# Patient Record
Sex: Female | Born: 1985 | Race: Black or African American | Hispanic: No | Marital: Married | Smoking: Former smoker
Health system: Southern US, Community
[De-identification: ages and names within clinical notes are randomized; demographics above are authoritative.]

## PROBLEM LIST (undated history)

## (undated) DIAGNOSIS — I10 Essential (primary) hypertension: Secondary | ICD-10-CM

## (undated) DIAGNOSIS — R0600 Dyspnea, unspecified: Secondary | ICD-10-CM

## (undated) DIAGNOSIS — F319 Bipolar disorder, unspecified: Secondary | ICD-10-CM

## (undated) DIAGNOSIS — M1990 Unspecified osteoarthritis, unspecified site: Secondary | ICD-10-CM

## (undated) DIAGNOSIS — J45909 Unspecified asthma, uncomplicated: Secondary | ICD-10-CM

## (undated) DIAGNOSIS — F419 Anxiety disorder, unspecified: Secondary | ICD-10-CM

## (undated) DIAGNOSIS — K219 Gastro-esophageal reflux disease without esophagitis: Secondary | ICD-10-CM

## (undated) DIAGNOSIS — T7840XA Allergy, unspecified, initial encounter: Secondary | ICD-10-CM

## (undated) DIAGNOSIS — D649 Anemia, unspecified: Secondary | ICD-10-CM

## (undated) HISTORY — PX: APPENDECTOMY: SHX54

## (undated) HISTORY — DX: Gastro-esophageal reflux disease without esophagitis: K21.9

## (undated) HISTORY — DX: Allergy, unspecified, initial encounter: T78.40XA

## (undated) HISTORY — DX: Unspecified osteoarthritis, unspecified site: M19.90

## (undated) HISTORY — DX: Unspecified asthma, uncomplicated: J45.909

## (undated) HISTORY — DX: Essential (primary) hypertension: I10

## (undated) HISTORY — PX: WISDOM TOOTH EXTRACTION: SHX21

## (undated) HISTORY — DX: Anxiety disorder, unspecified: F41.9

---

## 2014-06-25 ENCOUNTER — Emergency Department (HOSPITAL_COMMUNITY): Payer: Self-pay

## 2014-06-25 ENCOUNTER — Encounter (HOSPITAL_COMMUNITY): Payer: Self-pay | Admitting: Emergency Medicine

## 2014-06-25 ENCOUNTER — Emergency Department (HOSPITAL_COMMUNITY)
Admission: EM | Admit: 2014-06-25 | Discharge: 2014-06-25 | Disposition: A | Discharge status: Home / Self Care | Payer: Self-pay | Attending: Emergency Medicine | Admitting: Emergency Medicine

## 2014-06-25 DIAGNOSIS — D251 Intramural leiomyoma of uterus: Secondary | ICD-10-CM | POA: Insufficient documentation

## 2014-06-25 DIAGNOSIS — B9689 Other specified bacterial agents as the cause of diseases classified elsewhere: Secondary | ICD-10-CM | POA: Insufficient documentation

## 2014-06-25 DIAGNOSIS — A499 Bacterial infection, unspecified: Secondary | ICD-10-CM | POA: Insufficient documentation

## 2014-06-25 DIAGNOSIS — R109 Unspecified abdominal pain: Secondary | ICD-10-CM | POA: Insufficient documentation

## 2014-06-25 DIAGNOSIS — R42 Dizziness and giddiness: Secondary | ICD-10-CM | POA: Insufficient documentation

## 2014-06-25 DIAGNOSIS — N760 Acute vaginitis: Secondary | ICD-10-CM | POA: Insufficient documentation

## 2014-06-25 DIAGNOSIS — Z3202 Encounter for pregnancy test, result negative: Secondary | ICD-10-CM | POA: Insufficient documentation

## 2014-06-25 DIAGNOSIS — R350 Frequency of micturition: Secondary | ICD-10-CM | POA: Insufficient documentation

## 2014-06-25 DIAGNOSIS — IMO0002 Reserved for concepts with insufficient information to code with codable children: Secondary | ICD-10-CM | POA: Insufficient documentation

## 2014-06-25 DIAGNOSIS — F17200 Nicotine dependence, unspecified, uncomplicated: Secondary | ICD-10-CM | POA: Insufficient documentation

## 2014-06-25 LAB — COMPREHENSIVE METABOLIC PANEL
ALBUMIN: 4 g/dL (ref 3.5–5.2)
ALT: 15 U/L (ref 0–35)
AST: 17 U/L (ref 0–37)
Alkaline Phosphatase: 55 U/L (ref 39–117)
Anion gap: 14 (ref 5–15)
BUN: 11 mg/dL (ref 6–23)
CHLORIDE: 103 meq/L (ref 96–112)
CO2: 22 meq/L (ref 19–32)
Calcium: 9.7 mg/dL (ref 8.4–10.5)
Creatinine, Ser: 0.77 mg/dL (ref 0.50–1.10)
GFR calc Af Amer: >90 mL/min (ref >90)
Glucose, Bld: 86 mg/dL (ref 70–99)
Potassium: 3.9 mEq/L (ref 3.7–5.3)
SODIUM: 139 meq/L (ref 137–147)
Total Bilirubin: 0.2 mg/dL — ABNORMAL LOW (ref 0.3–1.2)
Total Protein: 8.7 g/dL — ABNORMAL HIGH (ref 6.0–8.3)

## 2014-06-25 LAB — CBC WITH DIFFERENTIAL/PLATELET
BASOS ABS: 0 10*3/uL (ref 0.0–0.1)
BASOS PCT: 0 % (ref 0–1)
Eosinophils Absolute: 0.2 10*3/uL (ref 0.0–0.7)
Eosinophils Relative: 2 % (ref 0–5)
HEMATOCRIT: 37 % (ref 36.0–46.0)
Hemoglobin: 12.5 g/dL (ref 12.0–15.0)
LYMPHS PCT: 41 % (ref 12–46)
Lymphs Abs: 3.1 10*3/uL (ref 0.7–4.0)
MCH: 29 pg (ref 26.0–34.0)
MCHC: 33.8 g/dL (ref 30.0–36.0)
MCV: 85.8 fL (ref 78.0–100.0)
Monocytes Absolute: 0.6 10*3/uL (ref 0.1–1.0)
Monocytes Relative: 8 % (ref 3–12)
NEUTROS ABS: 3.6 10*3/uL (ref 1.7–7.7)
Neutrophils Relative %: 49 % (ref 43–77)
Platelets: 265 10*3/uL (ref 150–400)
RBC: 4.31 MIL/uL (ref 3.87–5.11)
RDW: 12.7 % (ref 11.5–15.5)
WBC: 7.4 10*3/uL (ref 4.0–10.5)

## 2014-06-25 LAB — URINALYSIS, ROUTINE W REFLEX MICROSCOPIC
Bilirubin Urine: NEGATIVE
GLUCOSE, UA: NEGATIVE mg/dL
Hgb urine dipstick: NEGATIVE
Ketones, ur: NEGATIVE mg/dL
NITRITE: NEGATIVE
Protein, ur: NEGATIVE mg/dL
Specific Gravity, Urine: 1.023 (ref 1.005–1.030)
UROBILINOGEN UA: 1 mg/dL (ref 0.0–1.0)
pH: 6.5 (ref 5.0–8.0)

## 2014-06-25 LAB — PREGNANCY, URINE: Preg Test, Ur: NEGATIVE

## 2014-06-25 LAB — URINE MICROSCOPIC-ADD ON

## 2014-06-25 LAB — WET PREP, GENITAL
Trich, Wet Prep: NONE SEEN
Yeast Wet Prep HPF POC: NONE SEEN

## 2014-06-25 LAB — LIPASE, BLOOD: LIPASE: 36 U/L (ref 11–59)

## 2014-06-25 MED ORDER — MORPHINE SULFATE 4 MG/ML IJ SOLN
4.0000 mg | Freq: Once | INTRAMUSCULAR | Status: AC
Start: 2014-06-25 — End: 2014-06-25
  Administered 2014-06-25: 4 mg via INTRAVENOUS
  Filled 2014-06-25: qty 1

## 2014-06-25 MED ORDER — MECLIZINE HCL 50 MG PO TABS: 50.0000 mg | ORAL_TABLET | Freq: Three times a day (TID) | ORAL | Status: DC | PRN

## 2014-06-25 MED ORDER — ONDANSETRON HCL 4 MG/2ML IJ SOLN
4.0000 mg | Freq: Once | INTRAMUSCULAR | Status: AC
  Administered 2014-06-25: 4 mg via INTRAVENOUS
  Filled 2014-06-25: qty 2

## 2014-06-25 MED ORDER — ONDANSETRON HCL 4 MG PO TABS: 4.0000 mg | ORAL_TABLET | Freq: Four times a day (QID) | ORAL | Status: DC

## 2014-06-25 MED ORDER — METRONIDAZOLE 500 MG PO TABS: 500.0000 mg | ORAL_TABLET | Freq: Two times a day (BID) | ORAL | Status: DC

## 2014-06-25 MED ORDER — SODIUM CHLORIDE 0.9 % IV BOLUS (SEPSIS)
1000.0000 mL | Freq: Once | INTRAVENOUS | Status: AC
  Administered 2014-06-25: 1000 mL via INTRAVENOUS

## 2014-06-25 NOTE — ED Notes (Signed)
Pt states lower abdominal pain.  Slightly to left.  Pt states burning with urination.  No n/v.  Some dizziness.

## 2014-06-25 NOTE — ED Provider Notes (Signed)
CSN: [id number]     Arrival date & time 06/25/14  1603 History   First MD Initiated Contact with Patient 06/25/14 1838     Chief Complaint  Patient presents with  . Abdominal Pain  . Dysuria     (Consider location/radiation/quality/duration/timing/severity/associated sxs/prior Treatment) HPI Comments: Patient presents to the emergency department with chief complaint of lower abdominal pain, as well as dysuria. She states the symptoms have been ongoing for the past 2-3 days. States the pain is mostly located over the bladder and left side of her abdomen. She denies any nausea or vomiting. She reports one episode of nonbloody diarrhea. She has not taken anything to alleviate her symptoms. There are no aggravating factors. The pain does not radiate. Past abdominal surgical history consists of appendectomy and C-section.  Additionally, she states that she has had intermittent dizziness x2-3 weeks. She states that this happens occasionally, and feels like the room is spinning around in circles. It is worsened with movement.  The history is provided by the patient. No language interpreter was used.    History reviewed. No pertinent past medical history. Past Surgical History  Procedure Laterality Date  . Appendectomy    . Cesarean section     History reviewed. No pertinent family history. History  Substance Use Topics  . Smoking status: Current Every Day Smoker -- 0.50 packs/day    Types: Cigarettes  . Smokeless tobacco: Not on file  . Alcohol Use: Yes     Comment: social   OB History   Grav Para Term Preterm Abortions TAB SAB Ect Mult Living                 Review of Systems  Constitutional: Negative for fever and chills.  Respiratory: Negative for shortness of breath.   Cardiovascular: Negative for chest pain.  Gastrointestinal: Positive for abdominal pain. Negative for nausea, vomiting, diarrhea and constipation.  Genitourinary: Positive for frequency and dyspareunia. Negative  for dysuria.  Neurological: Positive for dizziness.  All other systems reviewed and are negative.     Allergies  Review of patient's allergies indicates no known allergies.  Home Medications   Prior to Admission medications   Not on File   BP 118/69  Pulse 85  Temp(Src) 98.7 F (37.1 C) (Oral)  Resp 18  SpO2 100%  LMP 10/29/2013 Physical Exam  Nursing note and vitals reviewed. Constitutional: She is oriented to person, place, and time. She appears well-developed and well-nourished.  HENT:  Head: Normocephalic and atraumatic.  Eyes: Conjunctivae and EOM are normal. Pupils are equal, round, and reactive to light.  Horizontal nystagmus  Neck: Normal range of motion. Neck supple.  Cardiovascular: Normal rate and regular rhythm.  Exam reveals no gallop and no friction rub.   No murmur heard. Pulmonary/Chest: Effort normal and breath sounds normal. No respiratory distress. She has no wheezes. She has no rales. She exhibits no tenderness.  Abdominal: Soft. Bowel sounds are normal. She exhibits no distension and no mass. There is tenderness. There is no rebound and no guarding.  Mild tenderness to palpation over the urinary bladder, and no other focal abdominal tenderness  Genitourinary:  Pelvic exam chaperoned by female ER tech, no right adnexal tenderness, moderate left adnexal tenderness, no uterine tenderness, mild vaginal discharge, no bleeding, no CMT or friability, no foreign body, no injury to the external genitalia, no other significant findings   Musculoskeletal: Normal range of motion. She exhibits no edema and no tenderness.  Patient ambulates appropriately  Neurological: She is alert and oriented to person, place, and time.  CN 3-12 intact, sensation and strength intact throughout  Skin: Skin is warm and dry.  Psychiatric: She has a normal mood and affect. Her behavior is normal. Judgment and thought content normal.    ED Course  Procedures (including critical care  time) Results for orders placed during the hospital encounter of 06/25/14  WET PREP, GENITAL      Result Value Ref Range   Yeast Wet Prep HPF POC NONE SEEN  NONE SEEN   Trich, Wet Prep NONE SEEN  NONE SEEN   Clue Cells Wet Prep HPF POC FEW (*) NONE SEEN   WBC, Wet Prep HPF POC FEW (*) NONE SEEN  URINALYSIS, ROUTINE W REFLEX MICROSCOPIC      Result Value Ref Range   Color, Urine YELLOW  YELLOW   APPearance CLOUDY (*) CLEAR   Specific Gravity, Urine 1.023  1.005 - 1.030   pH 6.5  5.0 - 8.0   Glucose, UA NEGATIVE  NEGATIVE mg/dL   Hgb urine dipstick NEGATIVE  NEGATIVE   Bilirubin Urine NEGATIVE  NEGATIVE   Ketones, ur NEGATIVE  NEGATIVE mg/dL   Protein, ur NEGATIVE  NEGATIVE mg/dL   Urobilinogen, UA 1.0  0.0 - 1.0 mg/dL   Nitrite NEGATIVE  NEGATIVE   Leukocytes, UA TRACE (*) NEGATIVE  CBC WITH DIFFERENTIAL      Result Value Ref Range   WBC 7.4  4.0 - 10.5 K/uL   RBC 4.31  3.87 - 5.11 MIL/uL   Hemoglobin 12.5  12.0 - 15.0 g/dL   HCT 37.0  36.0 - 46.0 %   MCV 85.8  78.0 - 100.0 fL   MCH 29.0  26.0 - 34.0 pg   MCHC 33.8  30.0 - 36.0 g/dL   RDW 12.7  11.5 - 15.5 %   Platelets 265  150 - 400 K/uL   Neutrophils Relative % 49  43 - 77 %   Neutro Abs 3.6  1.7 - 7.7 K/uL   Lymphocytes Relative 41  12 - 46 %   Lymphs Abs 3.1  0.7 - 4.0 K/uL   Monocytes Relative 8  3 - 12 %   Monocytes Absolute 0.6  0.1 - 1.0 K/uL   Eosinophils Relative 2  0 - 5 %   Eosinophils Absolute 0.2  0.0 - 0.7 K/uL   Basophils Relative 0  0 - 1 %   Basophils Absolute 0.0  0.0 - 0.1 K/uL  COMPREHENSIVE METABOLIC PANEL      Result Value Ref Range   Sodium 139  137 - 147 mEq/L   Potassium 3.9  3.7 - 5.3 mEq/L   Chloride 103  96 - 112 mEq/L   CO2 22  19 - 32 mEq/L   Glucose, Bld 86  70 - 99 mg/dL   BUN 11  6 - 23 mg/dL   Creatinine, Ser 0.77  0.50 - 1.10 mg/dL   Calcium 9.7  8.4 - 10.5 mg/dL   Total Protein 8.7 (*) 6.0 - 8.3 g/dL   Albumin 4.0  3.5 - 5.2 g/dL   AST 17  0 - 37 U/L   ALT 15  0 - 35 U/L    Alkaline Phosphatase 55  39 - 117 U/L   Total Bilirubin 0.2 (*) 0.3 - 1.2 mg/dL   GFR calc non Af Amer >90  >90 mL/min   GFR calc Af Amer >90  >90 mL/min   Anion gap 14  5 - 15  LIPASE, BLOOD      Result Value Ref Range   Lipase 36  11 - 59 U/L  PREGNANCY, URINE      Result Value Ref Range   Preg Test, Ur NEGATIVE  NEGATIVE  URINE MICROSCOPIC-ADD ON      Result Value Ref Range   Squamous Epithelial / LPF FEW (*) RARE   WBC, UA 3-6  <3 WBC/hpf   RBC / HPF 0-2  <3 RBC/hpf   US Transvaginal Non-ob  06/25/2014   CLINICAL DATA:  Left side pelvic pain.  EXAM: TRANSABDOMINAL ULTRASOUND OF PELVIS  TECHNIQUE: Transabdominal ultrasound examination of the pelvis was performed including evaluation of the uterus, ovaries, adnexal regions, and pelvic cul-de-sac.  COMPARISON:  None.  FINDINGS: Uterus  Measurements: 9.5 x 5.4 x 6.0 cm. 3 cm anterior left intramural fibroid.  Endometrium  Thickness: 8 mm in thickness.  No focal abnormality visualized.  Right ovary  Measurements: Not visualized due to overlying bowel gas and body habitus. No adnexal masses seen.  Left ovary  Measurements: Not visualized due to overlying bowel gas and body habitus. No adnexal masses seen  Other findings:  Trace free fluid in the pelvis.  IMPRESSION: 3 cm left lateral intramural fibroid.  Neither ovary could be visualized.  No adnexal masses seen.  Trace free fluid.   Electronically Signed   By: Rolm Baptise M.D.   On: 06/25/2014 20:41   US Pelvis Complete  06/25/2014   CLINICAL DATA:  Left side pelvic pain.  EXAM: TRANSABDOMINAL ULTRASOUND OF PELVIS  TECHNIQUE: Transabdominal ultrasound examination of the pelvis was performed including evaluation of the uterus, ovaries, adnexal regions, and pelvic cul-de-sac.  COMPARISON:  None.  FINDINGS: Uterus  Measurements: 9.5 x 5.4 x 6.0 cm. 3 cm anterior left intramural fibroid.  Endometrium  Thickness: 8 mm in thickness.  No focal abnormality visualized.  Right ovary  Measurements: Not  visualized due to overlying bowel gas and body habitus. No adnexal masses seen.  Left ovary  Measurements: Not visualized due to overlying bowel gas and body habitus. No adnexal masses seen  Other findings:  Trace free fluid in the pelvis.  IMPRESSION: 3 cm left lateral intramural fibroid.  Neither ovary could be visualized.  No adnexal masses seen.  Trace free fluid.   Electronically Signed   By: Rolm Baptise M.D.   On: 06/25/2014 20:41     Imaging Review No results found.   EKG Interpretation None      MDM   Final diagnoses:  Intramural leiomyoma of uterus  BV (bacterial vaginosis)   Patient with abdominal pain and dysuria. Will check labs, check urine, and reevaluate. Will treat symptoms.  Suspect that the patient's dizziness she does complain of intermittently for the past couple of weeks could be vertigo. She has horizontal nystagmus. Describes the dizziness as though the room is spinning. Will treat with meclizine. Patient has some tenderness over the left ovary, will order ultrasound.  9:00 PM Patient feels much better now. Ultrasound remarkable for uterine fibroid, wet prep remarkable for small amount of clue cells, will treat for bacterial vaginosis. Recommend NSAIDs for fibroid pain. Recommend meclizine for vertigo dizziness, and primary care followup. Patient understands and agrees with plan. She is not dizzy now. She feels well now. Her abdomen is soft and nontender. Discharged to home. Patient is stable and ready for discharge.    Montine Circle, PA-C 06/25/14 2101

## 2014-06-25 NOTE — ED Notes (Signed)
Also reports dizziness like room is spinning, been happening "a lot" x2 weeks. Also c/o vaginal discharge and irritation.

## 2014-06-25 NOTE — ED Provider Notes (Signed)
Medical screening examination/treatment/procedure(s) were performed by non-physician practitioner and as supervising physician I was immediately available for consultation/collaboration.   EKG Interpretation None        [place], DO 06/25/14 2124

## 2014-06-25 NOTE — Discharge Instructions (Signed)
Bacterial Vaginosis Bacterial vaginosis is a vaginal infection that occurs when the normal balance of bacteria in the vagina is disrupted. It results from an overgrowth of certain bacteria. This is the most common vaginal infection in women of childbearing age. Treatment is important to prevent complications, especially in pregnant women, as it can cause a premature delivery. CAUSES  Bacterial vaginosis is caused by an increase in harmful bacteria that are normally present in smaller amounts in the vagina. Several different kinds of bacteria can cause bacterial vaginosis. However, the reason that the condition develops is not fully understood. RISK FACTORS Certain activities or behaviors can put you at an increased risk of developing bacterial vaginosis, including:  Having a new sex partner or multiple sex partners.  Douching.  Using an intrauterine device (IUD) for contraception. Women do not get bacterial vaginosis from toilet seats, bedding, swimming pools, or contact with objects around them. SIGNS AND SYMPTOMS  Some women with bacterial vaginosis have no signs or symptoms. Common symptoms include:  Grey vaginal discharge.  A fishlike odor with discharge, especially after sexual intercourse.  Itching or burning of the vagina and vulva.  Burning or pain with urination. DIAGNOSIS  Your health care provider will take a medical history and examine the vagina for signs of bacterial vaginosis. A sample of vaginal fluid may be taken. Your health care provider will look at this sample under a microscope to check for bacteria and abnormal cells. A vaginal pH test may also be done.  TREATMENT  Bacterial vaginosis may be treated with antibiotic medicines. These may be given in the form of a pill or a vaginal cream. A second round of antibiotics may be prescribed if the condition comes back after treatment.  HOME CARE INSTRUCTIONS   Only take over-the-counter or prescription medicines as  directed by your health care provider.  If antibiotic medicine was prescribed, take it as directed. Make sure you finish it even if you start to feel better.  Do not have sex until treatment is completed.  Tell all sexual partners that you have a vaginal infection. They should see their health care provider and be treated if they have problems, such as a mild rash or itching.  Practice safe sex by using condoms and only having one sex partner. SEEK MEDICAL CARE IF:   Your symptoms are not improving after 3 days of treatment.  You have increased discharge or pain.  You have a fever. MAKE SURE YOU:   Understand these instructions.  Will watch your condition.  Will get help right away if you are not doing well or get worse. FOR MORE INFORMATION  Centers for Disease Control and Prevention, Division of STD Prevention: AppraiserFraud.fi American Sexual Health Association (ASHA): www.ashastd.org  Document Released: 10/15/2005 Document Revised: 08/05/2013 Document Reviewed: 05/27/2013 [facility] Patient Information 2015 [place], [place]. This information is not intended to replace advice given to you by your health care provider. Make sure you discuss any questions you have with your health care provider. Fibroids Fibroids are lumps (tumors) that can occur any place in a woman's body. These lumps are not cancerous. Fibroids vary in size, weight, and where they grow. HOME CARE  Do not take aspirin.  Write down the number of pads or tampons you use during your period. Tell your doctor. This can help determine the best treatment for you. GET HELP RIGHT AWAY IF:  You have pain in your lower belly (abdomen) that is not helped with medicine.  You have cramps  that are not helped with medicine.  You have more bleeding between or during your period.  You feel lightheaded or pass out (faint).  Your lower belly pain gets worse. MAKE SURE YOU:  Understand these instructions.  Will watch your  condition.  Will get help right away if you are not doing well or get worse. Document Released: 11/17/2010 Document Revised: 01/07/2012 Document Reviewed: 11/17/2010 [facility] Patient Information 2015 [place], [place]. This information is not intended to replace advice given to you by your health care provider. Make sure you discuss any questions you have with your health care provider.

## 2014-06-26 LAB — GC/CHLAMYDIA PROBE AMP
CT PROBE, AMP APTIMA: NEGATIVE
GC Probe RNA: NEGATIVE

## 2016-05-02 ENCOUNTER — Encounter: Payer: Self-pay | Admitting: Physical Therapy

## 2016-05-02 ENCOUNTER — Ambulatory Visit: Payer: Medicaid Other | Attending: Primary Care | Admitting: Physical Therapy

## 2016-05-02 DIAGNOSIS — M6281 Muscle weakness (generalized): Secondary | ICD-10-CM | POA: Diagnosis present

## 2016-05-02 DIAGNOSIS — M5416 Radiculopathy, lumbar region: Secondary | ICD-10-CM | POA: Diagnosis not present

## 2016-05-02 DIAGNOSIS — R293 Abnormal posture: Secondary | ICD-10-CM | POA: Diagnosis present
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
## 2016-05-02 NOTE — Patient Instructions (Addendum)

## 2018-05-02 DIAGNOSIS — F319 Bipolar disorder, unspecified: Secondary | ICD-10-CM | POA: Diagnosis not present

## 2018-05-08 DIAGNOSIS — H40033 Anatomical narrow angle, bilateral: Secondary | ICD-10-CM | POA: Diagnosis not present

## 2018-05-08 DIAGNOSIS — H5213 Myopia, bilateral: Secondary | ICD-10-CM | POA: Diagnosis not present

## 2018-05-08 DIAGNOSIS — H16223 Keratoconjunctivitis sicca, not specified as Sjogren's, bilateral: Secondary | ICD-10-CM | POA: Diagnosis not present

## 2018-05-09 DIAGNOSIS — F319 Bipolar disorder, unspecified: Secondary | ICD-10-CM | POA: Diagnosis not present

## 2018-05-16 DIAGNOSIS — F319 Bipolar disorder, unspecified: Secondary | ICD-10-CM | POA: Diagnosis not present

## 2018-05-26 DIAGNOSIS — F319 Bipolar disorder, unspecified: Secondary | ICD-10-CM | POA: Diagnosis not present

## 2018-05-27 DIAGNOSIS — Z79899 Other long term (current) drug therapy: Secondary | ICD-10-CM | POA: Diagnosis not present

## 2018-05-27 DIAGNOSIS — F411 Generalized anxiety disorder: Secondary | ICD-10-CM | POA: Diagnosis not present

## 2018-05-29 DIAGNOSIS — H1013 Acute atopic conjunctivitis, bilateral: Secondary | ICD-10-CM | POA: Diagnosis not present

## 2018-05-29 DIAGNOSIS — H5203 Hypermetropia, bilateral: Secondary | ICD-10-CM | POA: Diagnosis not present

## 2018-05-30 DIAGNOSIS — F319 Bipolar disorder, unspecified: Secondary | ICD-10-CM | POA: Diagnosis not present

## 2018-06-06 DIAGNOSIS — F319 Bipolar disorder, unspecified: Secondary | ICD-10-CM | POA: Diagnosis not present

## 2018-06-13 DIAGNOSIS — F319 Bipolar disorder, unspecified: Secondary | ICD-10-CM | POA: Diagnosis not present

## 2018-06-20 DIAGNOSIS — F319 Bipolar disorder, unspecified: Secondary | ICD-10-CM | POA: Diagnosis not present

## 2018-06-23 DIAGNOSIS — F319 Bipolar disorder, unspecified: Secondary | ICD-10-CM | POA: Diagnosis not present

## 2018-07-04 DIAGNOSIS — F319 Bipolar disorder, unspecified: Secondary | ICD-10-CM | POA: Diagnosis not present

## 2018-07-10 ENCOUNTER — Ambulatory Visit: Payer: Medicaid Other | Attending: Family Medicine | Admitting: Family Medicine

## 2018-07-10 ENCOUNTER — Encounter: Payer: Self-pay | Admitting: Family Medicine

## 2018-07-10 ENCOUNTER — Other Ambulatory Visit: Payer: Self-pay

## 2018-07-10 VITALS — BP 111/71 | HR 82 | Temp 98.5°F | Resp 18 | Ht 66.0 in | Wt 239.6 lb

## 2018-07-10 DIAGNOSIS — F419 Anxiety disorder, unspecified: Secondary | ICD-10-CM | POA: Diagnosis not present

## 2018-07-10 DIAGNOSIS — M5440 Lumbago with sciatica, unspecified side: Secondary | ICD-10-CM

## 2018-07-10 DIAGNOSIS — M545 Low back pain, unspecified: Secondary | ICD-10-CM

## 2018-07-10 DIAGNOSIS — G8929 Other chronic pain: Secondary | ICD-10-CM | POA: Diagnosis not present

## 2018-07-10 DIAGNOSIS — D259 Leiomyoma of uterus, unspecified: Secondary | ICD-10-CM

## 2018-07-10 DIAGNOSIS — R5383 Other fatigue: Secondary | ICD-10-CM | POA: Diagnosis not present

## 2018-07-10 DIAGNOSIS — M79671 Pain in right foot: Secondary | ICD-10-CM | POA: Diagnosis not present

## 2018-07-10 DIAGNOSIS — E010 Iodine-deficiency related diffuse (endemic) goiter: Secondary | ICD-10-CM | POA: Diagnosis not present

## 2018-07-10 DIAGNOSIS — D649 Anemia, unspecified: Secondary | ICD-10-CM | POA: Diagnosis not present

## 2018-07-10 DIAGNOSIS — Z79899 Other long term (current) drug therapy: Secondary | ICD-10-CM | POA: Diagnosis not present

## 2018-07-10 MED ORDER — IBUPROFEN 600 MG PO TABS: ORAL_TABLET | ORAL | 2 refills | Status: DC
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
## 2018-07-10 NOTE — Progress Notes (Signed)
Flu shot: no  Pain: back 9 for 8 years  , right foot 7: 2 months

## 2018-07-11 ENCOUNTER — Telehealth: Payer: Self-pay

## 2018-07-11 DIAGNOSIS — F319 Bipolar disorder, unspecified: Secondary | ICD-10-CM | POA: Diagnosis not present

## 2018-07-11 LAB — CBC WITH DIFFERENTIAL/PLATELET
Basophils Absolute: 0 x10E3/uL (ref 0.0–0.2)
Basos: 0 %
EOS (ABSOLUTE): 0.1 x10E3/uL (ref 0.0–0.4)
Eos: 2 %
Hematocrit: 35 % (ref 34.0–46.6)
Hemoglobin: 11.2 g/dL (ref 11.1–15.9)
Immature Grans (Abs): 0 x10E3/uL (ref 0.0–0.1)
Immature Granulocytes: 0 %
Lymphocytes Absolute: 2.3 x10E3/uL (ref 0.7–3.1)
Lymphs: 38 %
MCH: 27.7 pg (ref 26.6–33.0)
MCHC: 32 g/dL (ref 31.5–35.7)
MCV: 87 fL (ref 79–97)
Monocytes Absolute: 0.6 x10E3/uL (ref 0.1–0.9)
Monocytes: 10 %
Neutrophils Absolute: 3 x10E3/uL (ref 1.4–7.0)
Neutrophils: 50 %
Platelets: 288 x10E3/uL (ref 150–450)
RBC: 4.04 x10E6/uL (ref 3.77–5.28)
RDW: 12.2 % — ABNORMAL LOW (ref 12.3–15.4)
WBC: 6 x10E3/uL (ref 3.4–10.8)

## 2018-07-11 LAB — COMPREHENSIVE METABOLIC PANEL WITH GFR
ALT: 13 IU/L (ref 0–32)
AST: 17 IU/L (ref 0–40)
Albumin/Globulin Ratio: 1.3 (ref 1.2–2.2)
Albumin: 4.4 g/dL (ref 3.5–5.5)
Alkaline Phosphatase: 56 IU/L (ref 39–117)
BUN/Creatinine Ratio: 13 (ref 9–23)
BUN: 11 mg/dL (ref 6–20)
Bilirubin Total: 0.3 mg/dL (ref 0.0–1.2)
CO2: 22 mmol/L (ref 20–29)
Calcium: 9.9 mg/dL (ref 8.7–10.2)
Chloride: 103 mmol/L (ref 96–106)
Creatinine, Ser: 0.83 mg/dL (ref 0.57–1.00)
GFR calc Af Amer: 107 mL/min/1.73
GFR calc non Af Amer: 93 mL/min/1.73
Globulin, Total: 3.4 g/dL (ref 1.5–4.5)
Glucose: 89 mg/dL (ref 65–99)
Potassium: 4.2 mmol/L (ref 3.5–5.2)
Sodium: 140 mmol/L (ref 134–144)
Total Protein: 7.8 g/dL (ref 6.0–8.5)

## 2018-07-11 LAB — TSH: TSH: 4.35 u[IU]/mL (ref 0.450–4.500)

## 2018-07-11 NOTE — Telephone Encounter (Signed)
Patient was called, answered, verified DOB. Patient was informed and verbalized understanding of most recent lab results, no further questions.

## 2018-07-11 NOTE — Telephone Encounter (Signed)
-----   Message from Antony Blackbird, MD sent at 07/11/2018 10:46 AM EDT ----- Please notify patient of normal CBC, normal CMP normal TSH

## 2018-07-18 DIAGNOSIS — F319 Bipolar disorder, unspecified: Secondary | ICD-10-CM | POA: Diagnosis not present

## 2018-07-21 DIAGNOSIS — M129 Arthropathy, unspecified: Secondary | ICD-10-CM | POA: Diagnosis not present

## 2018-07-21 DIAGNOSIS — Z79899 Other long term (current) drug therapy: Secondary | ICD-10-CM | POA: Diagnosis not present

## 2018-07-21 DIAGNOSIS — M5440 Lumbago with sciatica, unspecified side: Secondary | ICD-10-CM | POA: Diagnosis not present

## 2018-07-21 DIAGNOSIS — G894 Chronic pain syndrome: Secondary | ICD-10-CM | POA: Diagnosis not present

## 2018-07-29 ENCOUNTER — Ambulatory Visit: Payer: Medicaid Other | Admitting: Podiatry

## 2018-07-29 ENCOUNTER — Encounter: Payer: Self-pay | Admitting: Podiatry

## 2018-07-29 ENCOUNTER — Ambulatory Visit (INDEPENDENT_AMBULATORY_CARE_PROVIDER_SITE_OTHER): Payer: Medicaid Other

## 2018-07-29 VITALS — BP 114/74 | HR 75

## 2018-07-29 DIAGNOSIS — S91331S Puncture wound without foreign body, right foot, sequela: Secondary | ICD-10-CM

## 2018-07-29 DIAGNOSIS — D492 Neoplasm of unspecified behavior of bone, soft tissue, and skin: Secondary | ICD-10-CM

## 2018-07-29 DIAGNOSIS — M79671 Pain in right foot: Secondary | ICD-10-CM

## 2018-07-29 DIAGNOSIS — Q828 Other specified congenital malformations of skin: Secondary | ICD-10-CM

## 2018-07-29 NOTE — Patient Instructions (Signed)
Keep the bandage on for 24 hours. At that time, remove and clean with soap and water. If it hurts or burns before 24 hours go ahead and remove the bandage and wash with soap and water. Keep the area clean. If there is any blistering cover with antibiotic ointment and a bandage. Monitor for any redness, drainage, or other signs of infection. Call the office if any are to occur. If you have any questions, please call the office at [phone].  If was nice to meet you today. If you have any questions or any further concerns, please feel fee to give me a call. You can call our office at [phone] or please feel fee to send me a message through [place].

## 2018-08-01 DIAGNOSIS — F319 Bipolar disorder, unspecified: Secondary | ICD-10-CM | POA: Diagnosis not present

## 2018-08-03 NOTE — Progress Notes (Signed)
Subjective:   Patient ID: Marsa Aris, female   DOB: 32 y.o.   MRN: [id number]   HPI 32 year old female presents the office today for concerns of pain to his right foot.  States that a year ago he injured his foot as he stepped on a nail over a year ago.  He did not seek treatment what time she was tetanus is up-to-date.  He states that the nail did not break and only punctured the skin lightly.  Since that he is developed a callus submetatarsal 5 and he gets pain with pressure to the area.  He states that it causes weight to shift resulting in pain to other areas of his foot.   Review of Systems  All other systems reviewed and are negative.  Past Medical History:  Diagnosis Date  . Anxiety    reports dx from previous MD  . Hypertension     Past Surgical History:  Procedure Laterality Date  . APPENDECTOMY    . CESAREAN SECTION       Current Outpatient Medications:  .  atomoxetine (STRATTERA) 40 MG capsule, Take 40 mg by mouth daily., Disp: , Rfl:  .  cholecalciferol (VITAMIN D) 1000 units tablet, Take 1,000 Units by mouth daily., Disp: , Rfl:  .  clonazePAM (KLONOPIN) 0.5 MG tablet, Take 0.5 mg by mouth 2 (two) times daily as needed for anxiety., Disp: , Rfl:  .  cycloSPORINE (RESTASIS) 0.05 % ophthalmic emulsion, 1 drop 2 (two) times daily., Disp: , Rfl:  .  etonogestrel (IMPLANON) 68 MG IMPL implant, Inject 1 each into the skin once., Disp: , Rfl:  .  hydrochlorothiazide (MICROZIDE) 12.5 MG capsule, Take 12.5 mg by mouth daily., Disp: , Rfl:  .  ibuprofen (ADVIL,MOTRIN) 600 MG tablet, 1 every 8 hours as needed for pain, take after eating, Disp: 60 tablet, Rfl: 2 .  meclizine (ANTIVERT) 50 MG tablet, Take 1 tablet (50 mg total) by mouth 3 (three) times daily as needed. (Patient not taking: Reported on 05/02/2016), Disp: 30 tablet, Rfl: 0 .  metroNIDAZOLE (FLAGYL) 500 MG tablet, Take 1 tablet (500 mg total) by mouth 2 (two) times daily. (Patient not taking: Reported on 05/02/2016),  Disp: 14 tablet, Rfl: 0 .  Olopatadine HCl (PAZEO) 0.7 % SOLN, Apply to eye., Disp: , Rfl:  .  ondansetron (ZOFRAN) 4 MG tablet, Take 1 tablet (4 mg total) by mouth every 6 (six) hours. (Patient not taking: Reported on 05/02/2016), Disp: 12 tablet, Rfl: 0 .  oxyCODONE-acetaminophen (PERCOCET/ROXICET) 5-325 MG tablet, Take 2 tablets by mouth every 4 (four) hours as needed for severe pain., Disp: , Rfl:   No Known Allergies        Objective:  Physical Exam  General: AAO x3, NAD  Dermatological: Hyperkeratotic tissue right foot submetatarsal 5.  Upon treatment there is no underlying ulceration, drainage or signs of infection.  There is no evidence of foreign body identified.  No other lesions identified.  Vascular: Dorsalis Pedis artery and Posterior Tibial artery pedal pulses are 2/4 bilateral with immedate capillary fill time. There is no pain with calf compression, swelling, warmth, erythema.   Neruologic: Grossly intact via light touch bilateral.  Protective threshold with Semmes Wienstein monofilament intact to all pedal sites bilateral.   Musculoskeletal: Tenderness the hyperkeratotic lesion but no other areas of tenderness identified.. Muscular strength 5/5 in all groups tested bilateral.  Gait: Unassisted, Nonantalgic.       Assessment:   32 year old female with symptomatic hyperkeratotic lesion right  foot     Plan:  -Treatment options discussed including all alternatives, risks, and complications -Etiology of symptoms were discussed -X-rays were obtained and reviewed with the patient.  There is no evidence of foreign body or fracture. -Lesion was sharply debrided x1 without any complications or bleeding.  Areas cleaned with alcohol and a pad was placed followed by salicylic acid and a bandage.  Post procedure instructions were discussed.  Monitoring signs or symptoms of infection.  Offloading.  Trula Slade DPM

## 2018-08-04 DIAGNOSIS — M5416 Radiculopathy, lumbar region: Secondary | ICD-10-CM | POA: Diagnosis not present

## 2018-08-04 DIAGNOSIS — G8929 Other chronic pain: Secondary | ICD-10-CM | POA: Diagnosis not present

## 2018-08-04 DIAGNOSIS — Z79899 Other long term (current) drug therapy: Secondary | ICD-10-CM | POA: Diagnosis not present

## 2018-08-04 DIAGNOSIS — G894 Chronic pain syndrome: Secondary | ICD-10-CM | POA: Diagnosis not present

## 2018-08-05 DIAGNOSIS — F319 Bipolar disorder, unspecified: Secondary | ICD-10-CM | POA: Diagnosis not present

## 2018-08-11 DIAGNOSIS — F319 Bipolar disorder, unspecified: Secondary | ICD-10-CM | POA: Diagnosis not present

## 2018-08-18 DIAGNOSIS — Z79899 Other long term (current) drug therapy: Secondary | ICD-10-CM | POA: Diagnosis not present

## 2018-08-18 DIAGNOSIS — F319 Bipolar disorder, unspecified: Secondary | ICD-10-CM | POA: Diagnosis not present

## 2018-08-18 DIAGNOSIS — F411 Generalized anxiety disorder: Secondary | ICD-10-CM | POA: Diagnosis not present

## 2018-08-19 ENCOUNTER — Encounter: Payer: Self-pay | Admitting: Podiatry

## 2018-08-19 ENCOUNTER — Ambulatory Visit: Payer: Medicaid Other | Admitting: Podiatry

## 2018-08-19 DIAGNOSIS — Q828 Other specified congenital malformations of skin: Secondary | ICD-10-CM

## 2018-08-19 DIAGNOSIS — D492 Neoplasm of unspecified behavior of bone, soft tissue, and skin: Secondary | ICD-10-CM | POA: Diagnosis not present

## 2018-08-24 NOTE — Progress Notes (Signed)
Subjective: 32 year old female presents the office today for follow-up evaluation of a hyperkeratotic lesion on the ball of her right foot.  She states that looks and feels much better.  No complications after the treatment appointment.  No other concerns. Denies any systemic complaints such as fevers, chills, nausea, vomiting. No acute changes since last appointment, and no other complaints at this time.   Objective: AAO x3, NAD DP/PT pulses palpable bilaterally, CRT less than 3 seconds Hyperkeratotic lesion right foot submetatarsal 5.  Upon debridement there is no underlying ulceration, drainage or any signs of infection and there is no foreign body noted. No open lesions or pre-ulcerative lesions.  No pain with calf compression, swelling, warmth, erythema  Assessment: Improving hyperkeratotic lesion right foot, porokeratosis  Plan: -All treatment options discussed with the patient including all alternatives, risks, complications.  -Lesion sharply debrided today with a 312 scalpel without any complications or bleeding.  Areas cleaned with alcohol and a pad was placed followed by salicylic acid and a bandage.  Post procedure instructions discussed.  Monitoring signs or symptoms of infection discussed. -Patient encouraged to call the office with any questions, concerns, change in symptoms.   Trula Slade DPM

## 2018-09-01 DIAGNOSIS — F319 Bipolar disorder, unspecified: Secondary | ICD-10-CM | POA: Diagnosis not present

## 2018-09-08 DIAGNOSIS — F319 Bipolar disorder, unspecified: Secondary | ICD-10-CM | POA: Diagnosis not present

## 2018-09-09 ENCOUNTER — Ambulatory Visit (INDEPENDENT_AMBULATORY_CARE_PROVIDER_SITE_OTHER): Payer: Medicaid Other | Admitting: Podiatry

## 2018-09-09 DIAGNOSIS — D492 Neoplasm of unspecified behavior of bone, soft tissue, and skin: Secondary | ICD-10-CM

## 2018-09-09 NOTE — Progress Notes (Signed)
Subjective: 32 year old female presents the office today for follow-up evaluation of a hyperkeratotic lesion on the ball of her right foot.  She states that looks and feels much better. She did peel a piece of skin off.  No complications after the treatment appointment.  No other concerns. Denies any systemic complaints such as fevers, chills, nausea, vomiting. No acute changes since last appointment, and no other complaints at this time.   Objective: AAO x3, NAD DP/PT pulses palpable bilaterally, CRT less than 3 seconds Hyperkeratotic lesion right foot submetatarsal 5.  Upon debridement there is no underlying ulceration, drainage or any signs of infection and there is no foreign body noted. It is much smaller in size.  No open lesions or pre-ulcerative lesions.  No pain with calf compression, swelling, warmth, erythema  Assessment: Improving hyperkeratotic lesion right foot, porokeratosis  Plan: -All treatment options discussed with the patient including all alternatives, risks, complications.  -Lesion sharply debrided today with a 312 scalpel without any complications or bleeding.  Areas cleaned with alcohol and a pad was placed followed by salicylic acid and a bandage.  Post procedure instructions discussed.  Monitoring signs or symptoms of infection discussed. -Patient encouraged to call the office with any questions, concerns, change in symptoms.   Trula Slade DPM

## 2018-09-15 DIAGNOSIS — F319 Bipolar disorder, unspecified: Secondary | ICD-10-CM | POA: Diagnosis not present

## 2018-09-22 DIAGNOSIS — F319 Bipolar disorder, unspecified: Secondary | ICD-10-CM | POA: Diagnosis not present

## 2018-09-29 DIAGNOSIS — F319 Bipolar disorder, unspecified: Secondary | ICD-10-CM | POA: Diagnosis not present

## 2018-10-08 DIAGNOSIS — F319 Bipolar disorder, unspecified: Secondary | ICD-10-CM | POA: Diagnosis not present

## 2018-10-13 DIAGNOSIS — F319 Bipolar disorder, unspecified: Secondary | ICD-10-CM | POA: Diagnosis not present

## 2018-10-16 ENCOUNTER — Other Ambulatory Visit (HOSPITAL_COMMUNITY)
Admission: RE | Admit: 2018-10-16 | Discharge: 2018-10-16 | Disposition: A | Discharge status: Home / Self Care | Payer: Medicaid Other | Source: Ambulatory Visit | Attending: Obstetrics and Gynecology | Admitting: Obstetrics and Gynecology

## 2018-10-16 ENCOUNTER — Encounter: Payer: Self-pay | Admitting: Obstetrics and Gynecology

## 2018-10-16 ENCOUNTER — Ambulatory Visit: Payer: Medicaid Other | Admitting: Obstetrics and Gynecology

## 2018-10-16 VITALS — BP 117/75 | HR 95 | Ht 66.0 in | Wt 243.6 lb

## 2018-10-16 DIAGNOSIS — Z01419 Encounter for gynecological examination (general) (routine) without abnormal findings: Secondary | ICD-10-CM | POA: Diagnosis not present

## 2018-10-16 DIAGNOSIS — Z0000 Encounter for general adult medical examination without abnormal findings: Secondary | ICD-10-CM | POA: Diagnosis not present

## 2018-10-16 DIAGNOSIS — D259 Leiomyoma of uterus, unspecified: Secondary | ICD-10-CM

## 2018-10-16 DIAGNOSIS — Z113 Encounter for screening for infections with a predominantly sexual mode of transmission: Secondary | ICD-10-CM | POA: Diagnosis not present

## 2018-10-16 DIAGNOSIS — R102 Pelvic and perineal pain: Secondary | ICD-10-CM

## 2018-10-16 NOTE — Progress Notes (Signed)
GYNECOLOGY ANNUAL PREVENTATIVE CARE ENCOUNTER NOTE  Subjective:   Brianna Jordan is a 32 y.o. G77P1002 female here for a annual gynecologic exam. Current complaints: heavy periods, fibroids. Reports monthly periods 5-6 of the big pads in a day, bleeds 5 days. Has occasional pelvic pain with intercourse with certain positions. Denies abnormal discharge, problems with intercourse or other gynecologic concerns.   Is not planning on having any kids in future.  Has bene told she has fibroids by ED and is here to check on them.   Gynecologic History Patient's last menstrual period was 09/24/2018. Contraception: Nexplanon, put in 10/2015, due for replacement Last Pap: 3 years ago. Results were: normal Last mammogram: n/a Gardisil: has not received, declines  Obstetric History OB History  Gravida Para Term Preterm AB Living  2 1 1     2   SAB TAB Ectopic Multiple Live Births          2    # Outcome Date GA Lbr Len/2nd Weight Sex Delivery Anes PTL Lv  2 Term 08/17/10     CS-LTranv   LIV  1 Gravida 04/23/06     Vag-Spont   LIV   Past Medical History:  Diagnosis Date  . Anxiety    reports dx from previous MD  . Hypertension    Past Surgical History:  Procedure Laterality Date  . APPENDECTOMY    . CESAREAN SECTION      Current Outpatient Medications on File Prior to Visit  Medication Sig Dispense Refill  . atomoxetine (STRATTERA) 40 MG capsule Take 40 mg by mouth daily.    . baclofen (LIORESAL) 10 MG tablet Take 10 mg by mouth 3 (three) times daily.    . clonazePAM (KLONOPIN) 0.5 MG tablet Take 0.5 mg by mouth 2 (two) times daily as needed for anxiety.    Marland Kitchen etonogestrel (IMPLANON) 68 MG IMPL implant Inject 1 each into the skin once.    . lurasidone (LATUDA) 40 MG TABS tablet Take 40 mg by mouth daily with breakfast.    . cholecalciferol (VITAMIN D) 1000 units tablet Take 1,000 Units by mouth daily.    . cycloSPORINE (RESTASIS) 0.05 % ophthalmic emulsion 1 drop 2 (two) times  daily.    Marland Kitchen ibuprofen (ADVIL,MOTRIN) 600 MG tablet 1 every 8 hours as needed for pain, take after eating (Patient not taking: Reported on 10/16/2018) 60 tablet 2  . Olopatadine HCl (PAZEO) 0.7 % SOLN Apply to eye.     No current facility-administered medications on file prior to visit.    No Known Allergies  Social History   Socioeconomic History  . Marital status: Married    Spouse name: Not on file  . Number of children: Not on file  . Years of education: Not on file  . Highest education level: Not on file  Occupational History  . Not on file  Social Needs  . Financial resource strain: Not on file  . Food insecurity:    Worry: Not on file    Inability: Not on file  . Transportation needs:    Medical: Not on file    Non-medical: Not on file  Tobacco Use  . Smoking status: Current Every Day Smoker    Packs/day: 0.50    Types: Cigarettes, Cigars  . Smokeless tobacco: Never Used  Substance and Sexual Activity  . Alcohol use: Yes    Comment: social  . Drug use: No  . Sexual activity: Yes    Birth control/protection: Implant  Lifestyle  . Physical activity:    Days per week: Not on file    Minutes per session: Not on file  . Stress: Not on file  Relationships  . Social connections:    Talks on phone: Not on file    Gets together: Not on file    Attends religious service: Not on file    Active member of club or organization: Not on file    Attends meetings of clubs or organizations: Not on file    Relationship status: Not on file  . Intimate partner violence:    Fear of current or ex partner: Not on file    Emotionally abused: Not on file    Physically abused: Not on file    Forced sexual activity: Not on file  Other Topics Concern  . Not on file  Social History Narrative  . Not on file    History reviewed. No pertinent family history.  The following portions of the patient's history were reviewed and updated as appropriate: allergies, current medications,  past family history, past medical history, past social history, past surgical history and problem list.  Review of Systems Pertinent items are noted in HPI.   Objective:  BP 117/75   Pulse 95   Ht 5\' 6"  (1.676 m)   Wt 243 lb 9.6 oz (110.5 kg)   LMP 09/24/2018   BMI 39.32 kg/m  CONSTITUTIONAL: Well-developed, well-nourished female in no acute distress.  HENT:  Normocephalic, atraumatic, External right and left ear normal. Oropharynx is clear and moist EYES: Conjunctivae and EOM are normal. Pupils are equal, round, and reactive to light. No scleral icterus.  NECK: Normal range of motion, supple, no masses.  Normal thyroid.  SKIN: Skin is warm and dry. No rash noted. Not diaphoretic. No erythema. No pallor. NEUROLOGIC: Alert and oriented to person, place, and time. Normal reflexes, muscle tone coordination. No cranial nerve deficit noted. PSYCHIATRIC: Normal mood and affect. Normal behavior. Normal judgment and thought content. CARDIOVASCULAR: Normal heart rate noted, regular rhythm RESPIRATORY: Clear to auscultation bilaterally. Effort and breath sounds normal, no problems with respiration noted. BREASTS: Symmetric in size. No masses, skin changes, nipple drainage, or lymphadenopathy. Several tender boils in left axilla ABDOMEN: Soft, normal bowel sounds, no distention noted.  No tenderness, rebound or guarding.  PELVIC: Normal appearing external genitalia; normal appearing vaginal mucosa and cervix.  No abnormal discharge noted.  Pap smear obtained.  Normal uterine size, no other palpable masses, no uterine or adnexal tenderness. MUSCULOSKELETAL: Normal range of motion. No tenderness.  No cyanosis, clubbing, or edema.  2+ distal pulses.   Assessment and Plan:   1. Well woman exam Routine exam - Cytology - PAP( [facility]) - to see gen surg regarding axillary boils - return one month for nexplanon change  2. Routine screening for STI (sexually transmitted infection) - HIV Antibody  (routine testing w rflx) - RPR - Hepatitis C Antibody - Hepatitis B surface antigen  3. Pelvic pain See below  4. Uterine leiomyoma, unspecified location Has been told she has fibroids, none palpated on exam - will send for [place] and return for discussion given that she has very heavy periods - US PELVIC COMPLETE WITH TRANSVAGINAL; Future  Counseled regarding risks/benefits of flu vaccine, patient declines vaccine.   Will follow up results of pap smear/STI screen and manage accordingly. Encouraged improvement in diet and exercise.  Mammogram n/a Flu vaccine declined Gardisil declined  Routine preventative health maintenance measures emphasized. Please refer to  After Visit Summary for other counseling recommendations.   Feliz Beam, M.D. Attending Center for [employer] [facility])

## 2018-10-16 NOTE — Progress Notes (Signed)
New patient in the office related to fibroids, currently has nexpanon, pt states not took much pain, only occasionally with intercourse. Pt states very heavy menstrual cycle even with nexplanon in place. Pt also desires std testing today.

## 2018-10-18 LAB — HIV ANTIBODY (ROUTINE TESTING W REFLEX): HIV SCREEN 4TH GENERATION: NONREACTIVE

## 2018-10-18 LAB — HEPATITIS C ANTIBODY: Hep C Virus Ab: <0.1 s/co ratio (ref 0.0–0.9)

## 2018-10-18 LAB — HEPATITIS B SURFACE ANTIGEN: Hepatitis B Surface Ag: NEGATIVE

## 2018-10-18 LAB — RPR: RPR Ser Ql: NONREACTIVE

## 2018-10-20 LAB — CYTOLOGY - PAP
Bacterial vaginitis: POSITIVE — AB
CHLAMYDIA, DNA PROBE: NEGATIVE
Candida vaginitis: NEGATIVE
Diagnosis: NEGATIVE
HPV: NOT DETECTED
Neisseria Gonorrhea: NEGATIVE
Trichomonas: NEGATIVE

## 2018-10-23 ENCOUNTER — Ambulatory Visit (HOSPITAL_COMMUNITY)
Admission: RE | Admit: 2018-10-23 | Discharge: 2018-10-23 | Disposition: A | Discharge status: Home / Self Care | Payer: Medicaid Other | Source: Ambulatory Visit | Attending: Obstetrics and Gynecology | Admitting: Obstetrics and Gynecology

## 2018-10-23 DIAGNOSIS — N83202 Unspecified ovarian cyst, left side: Secondary | ICD-10-CM | POA: Diagnosis not present

## 2018-10-23 DIAGNOSIS — D259 Leiomyoma of uterus, unspecified: Secondary | ICD-10-CM

## 2018-10-23 DIAGNOSIS — N83292 Other ovarian cyst, left side: Secondary | ICD-10-CM | POA: Diagnosis not present

## 2018-10-23 MED ORDER — METRONIDAZOLE 500 MG PO TABS: 500.0000 mg | ORAL_TABLET | Freq: Two times a day (BID) | ORAL | 0 refills | Status: DC

## 2018-10-23 NOTE — Addendum Note (Signed)
Addended by: Vivien Rota on: 10/23/2018 04:33 PM   Modules accepted: Orders

## 2018-10-27 DIAGNOSIS — F319 Bipolar disorder, unspecified: Secondary | ICD-10-CM | POA: Diagnosis not present

## 2018-11-05 DIAGNOSIS — F319 Bipolar disorder, unspecified: Secondary | ICD-10-CM | POA: Diagnosis not present

## 2018-11-10 DIAGNOSIS — F319 Bipolar disorder, unspecified: Secondary | ICD-10-CM | POA: Diagnosis not present

## 2018-11-13 ENCOUNTER — Ambulatory Visit: Payer: Medicaid Other | Admitting: Certified Nurse Midwife

## 2018-11-13 ENCOUNTER — Encounter: Payer: Self-pay | Admitting: Certified Nurse Midwife

## 2018-11-13 VITALS — BP 102/73 | HR 80 | Wt 238.2 lb

## 2018-11-13 DIAGNOSIS — Z30017 Encounter for initial prescription of implantable subdermal contraceptive: Secondary | ICD-10-CM | POA: Diagnosis not present

## 2018-11-13 DIAGNOSIS — Z3046 Encounter for surveillance of implantable subdermal contraceptive: Secondary | ICD-10-CM | POA: Diagnosis not present

## 2018-11-13 LAB — POCT URINE PREGNANCY: Preg Test, Ur: NEGATIVE

## 2018-11-13 MED ORDER — ETONOGESTREL 68 MG ~~LOC~~ IMPL
68.0000 mg | DRUG_IMPLANT | Freq: Once | SUBCUTANEOUS | Status: AC
  Administered 2018-11-13: 68 mg via SUBCUTANEOUS

## 2018-11-13 NOTE — Progress Notes (Signed)
GYNECOLOGY CLINIC PROCEDURE NOTE  Ms. Brianna Jordan is a 33 y.o. G2P1002 here for Nexplanon removal and reinsertion of Nexplanon. No GYN concerns.  Last pap smear was on 10/16/2018 and was normal.  No other gynecologic concerns.  Nexplanon Removal and Insertion  Patient was given informed consent for removal of her Implanon and insertion of Nexplanon.  Patient does understand that irregular bleeding is a very common side effect of this medication. She was advised to have backup contraception for one week after replacement of the implant. Pregnancy test in clinic today was negative.  Appropriate time out taken. Implanon site identified. Area prepped in usual sterile fashon. One ml of 1% lidocaine was used to anesthetize the area at the distal end of the implant. A small stab incision was made right beside the implant on the distal portion. The Nexplanon rod was grasped using hemostats and removed without difficulty. There was minimal blood loss. There were no complications. Area was then injected with 3 ml of 1 % lidocaine. She was re-prepped with betadine, Nexplanon removed from packaging, Device confirmed in needle, then inserted full length of needle and withdrawn per handbook instructions. Nexplanon was able to palpated in the patient's arm; patient palpated the insert herself.  There was minimal blood loss. Patient insertion site covered with guaze and a pressure bandage to reduce any bruising. The patient tolerated the procedure well and was given post procedure instructions.   Lajean Manes, CNM 11/13/2018 10:15 AM

## 2018-11-13 NOTE — Progress Notes (Signed)
Pt is in the office for nexplanon removal/ re-insertion.

## 2018-11-13 NOTE — Patient Instructions (Signed)
Nexplanon Instructions After Insertion  Keep bandage clean and dry for 24 hours  May use ice/Tylenol/Ibuprofen for soreness or pain  If you develop fever, drainage or increased warmth from incision site-contact office immediately   

## 2018-11-17 DIAGNOSIS — F319 Bipolar disorder, unspecified: Secondary | ICD-10-CM | POA: Diagnosis not present

## 2018-11-17 DIAGNOSIS — F411 Generalized anxiety disorder: Secondary | ICD-10-CM | POA: Diagnosis not present

## 2018-11-17 DIAGNOSIS — Z79899 Other long term (current) drug therapy: Secondary | ICD-10-CM | POA: Diagnosis not present

## 2018-11-24 DIAGNOSIS — F319 Bipolar disorder, unspecified: Secondary | ICD-10-CM | POA: Diagnosis not present

## 2018-11-27 DIAGNOSIS — F319 Bipolar disorder, unspecified: Secondary | ICD-10-CM | POA: Diagnosis not present

## 2018-12-08 DIAGNOSIS — F319 Bipolar disorder, unspecified: Secondary | ICD-10-CM | POA: Diagnosis not present

## 2018-12-19 DIAGNOSIS — F319 Bipolar disorder, unspecified: Secondary | ICD-10-CM | POA: Diagnosis not present

## 2018-12-22 DIAGNOSIS — F319 Bipolar disorder, unspecified: Secondary | ICD-10-CM | POA: Diagnosis not present

## 2018-12-29 DIAGNOSIS — F319 Bipolar disorder, unspecified: Secondary | ICD-10-CM | POA: Diagnosis not present

## 2019-01-06 DIAGNOSIS — F319 Bipolar disorder, unspecified: Secondary | ICD-10-CM | POA: Diagnosis not present

## 2019-01-07 ENCOUNTER — Encounter: Payer: Self-pay | Admitting: Podiatry

## 2019-01-07 ENCOUNTER — Other Ambulatory Visit: Payer: Self-pay

## 2019-01-07 ENCOUNTER — Ambulatory Visit (INDEPENDENT_AMBULATORY_CARE_PROVIDER_SITE_OTHER): Payer: Medicaid Other | Admitting: Podiatry

## 2019-01-07 DIAGNOSIS — Q828 Other specified congenital malformations of skin: Secondary | ICD-10-CM | POA: Diagnosis not present

## 2019-01-07 NOTE — Progress Notes (Signed)
This patient presents the office today for a painful callus on the outside ball of  her right foot.  She has a history of having stepped on an object and has now developed a hyperkeratotic lesion.  She has had previous debridement performed by Dr. Jacqualyn Posey which has helped helped.  She says the callus has returned and her  pain returned and is painful walking and wearing her shoes.  She presents the office today for an evaluation and treatment of her painful callus.  Vascular  Dorsalis pedis and posterior tibial pulses are palpable  B/L.  Capillary return  WNL.  Temperature gradient is  WNL.  Skin turgor  WNL  Sensorium  Senn Weinstein monofilament wire  WNL. Normal tactile sensation.  Nail Exam  Patient has normal nails with no evidence of bacterial or fungal infection.  Orthopedic  Exam  Muscle tone and muscle strength  WNL.  No limitations of motion feet  B/L.  No crepitus or joint effusion noted.  Foot type is unremarkable and digits show no abnormalities.  Bony prominences are unremarkable.  Skin  No open lesions.  Normal skin texture and turgor. Porokeratotic lesion distal 4,5 MPJ right foot.  Porokeratosis right foot  ROV  Debride porokeratosis  Right Foot.  Gardiner Barefoot DPM

## 2019-01-12 DIAGNOSIS — F319 Bipolar disorder, unspecified: Secondary | ICD-10-CM | POA: Diagnosis not present

## 2019-01-19 DIAGNOSIS — F319 Bipolar disorder, unspecified: Secondary | ICD-10-CM | POA: Diagnosis not present

## 2019-01-26 DIAGNOSIS — F319 Bipolar disorder, unspecified: Secondary | ICD-10-CM | POA: Diagnosis not present

## 2019-02-02 DIAGNOSIS — F319 Bipolar disorder, unspecified: Secondary | ICD-10-CM | POA: Diagnosis not present

## 2019-02-04 DIAGNOSIS — F411 Generalized anxiety disorder: Secondary | ICD-10-CM | POA: Diagnosis not present

## 2019-02-09 DIAGNOSIS — F319 Bipolar disorder, unspecified: Secondary | ICD-10-CM | POA: Diagnosis not present

## 2019-02-16 DIAGNOSIS — F319 Bipolar disorder, unspecified: Secondary | ICD-10-CM | POA: Diagnosis not present

## 2019-02-23 DIAGNOSIS — F319 Bipolar disorder, unspecified: Secondary | ICD-10-CM | POA: Diagnosis not present

## 2019-03-02 DIAGNOSIS — F319 Bipolar disorder, unspecified: Secondary | ICD-10-CM | POA: Diagnosis not present

## 2019-03-09 DIAGNOSIS — F319 Bipolar disorder, unspecified: Secondary | ICD-10-CM | POA: Diagnosis not present

## 2019-03-16 DIAGNOSIS — F319 Bipolar disorder, unspecified: Secondary | ICD-10-CM | POA: Diagnosis not present

## 2019-03-24 DIAGNOSIS — F319 Bipolar disorder, unspecified: Secondary | ICD-10-CM | POA: Diagnosis not present

## 2019-03-30 DIAGNOSIS — F319 Bipolar disorder, unspecified: Secondary | ICD-10-CM | POA: Diagnosis not present

## 2019-04-06 DIAGNOSIS — F319 Bipolar disorder, unspecified: Secondary | ICD-10-CM | POA: Diagnosis not present

## 2019-04-13 DIAGNOSIS — F319 Bipolar disorder, unspecified: Secondary | ICD-10-CM | POA: Diagnosis not present

## 2019-04-20 DIAGNOSIS — F319 Bipolar disorder, unspecified: Secondary | ICD-10-CM | POA: Diagnosis not present

## 2019-04-27 DIAGNOSIS — F319 Bipolar disorder, unspecified: Secondary | ICD-10-CM | POA: Diagnosis not present

## 2019-05-04 DIAGNOSIS — F319 Bipolar disorder, unspecified: Secondary | ICD-10-CM | POA: Diagnosis not present

## 2019-05-07 DIAGNOSIS — H16223 Keratoconjunctivitis sicca, not specified as Sjogren's, bilateral: Secondary | ICD-10-CM | POA: Diagnosis not present

## 2019-05-07 DIAGNOSIS — H40033 Anatomical narrow angle, bilateral: Secondary | ICD-10-CM | POA: Diagnosis not present

## 2019-05-11 DIAGNOSIS — F319 Bipolar disorder, unspecified: Secondary | ICD-10-CM | POA: Diagnosis not present

## 2019-05-18 DIAGNOSIS — F319 Bipolar disorder, unspecified: Secondary | ICD-10-CM | POA: Diagnosis not present

## 2019-05-25 DIAGNOSIS — F319 Bipolar disorder, unspecified: Secondary | ICD-10-CM | POA: Diagnosis not present

## 2019-06-01 DIAGNOSIS — F319 Bipolar disorder, unspecified: Secondary | ICD-10-CM | POA: Diagnosis not present

## 2019-06-08 DIAGNOSIS — F319 Bipolar disorder, unspecified: Secondary | ICD-10-CM | POA: Diagnosis not present

## 2019-06-15 DIAGNOSIS — F319 Bipolar disorder, unspecified: Secondary | ICD-10-CM | POA: Diagnosis not present

## 2019-06-22 DIAGNOSIS — F319 Bipolar disorder, unspecified: Secondary | ICD-10-CM | POA: Diagnosis not present

## 2019-06-29 DIAGNOSIS — F319 Bipolar disorder, unspecified: Secondary | ICD-10-CM | POA: Diagnosis not present

## 2019-06-30 DIAGNOSIS — F319 Bipolar disorder, unspecified: Secondary | ICD-10-CM | POA: Diagnosis not present

## 2019-07-07 DIAGNOSIS — F319 Bipolar disorder, unspecified: Secondary | ICD-10-CM | POA: Diagnosis not present

## 2019-07-13 DIAGNOSIS — F319 Bipolar disorder, unspecified: Secondary | ICD-10-CM | POA: Diagnosis not present

## 2019-07-20 DIAGNOSIS — F319 Bipolar disorder, unspecified: Secondary | ICD-10-CM | POA: Diagnosis not present

## 2019-07-26 ENCOUNTER — Encounter (HOSPITAL_COMMUNITY): Payer: Self-pay

## 2019-07-26 ENCOUNTER — Other Ambulatory Visit: Payer: Self-pay

## 2019-07-26 ENCOUNTER — Ambulatory Visit (HOSPITAL_COMMUNITY)
Admission: EM | Admit: 2019-07-26 | Discharge: 2019-07-26 | Disposition: A | Discharge status: Home / Self Care | Payer: Medicaid Other | Attending: Urgent Care | Admitting: Urgent Care

## 2019-07-26 DIAGNOSIS — L732 Hidradenitis suppurativa: Secondary | ICD-10-CM

## 2019-07-26 MED ORDER — CEPHALEXIN 500 MG PO CAPS: 500.0000 mg | ORAL_CAPSULE | Freq: Three times a day (TID) | ORAL | 0 refills | Status: DC

## 2019-07-26 NOTE — ED Triage Notes (Signed)
Pt present a boil underneath her left arm. Pt states that the boil has buster but it has a foul odor.

## 2019-07-26 NOTE — ED Provider Notes (Signed)
MRN: [id number] DOB: May 31, 1986  Subjective:   Brianna Jordan is a 33 y.o. female presenting for recurrent boil over the left arm.  Patient states that she has had multiple problems with this in the past, this most recent boil burst yesterday and she felt significant relief.  However she is gotten really sick in the past from these boils and would like an antibiotic course.  She has never seen a specialist for recurrent skin infections.  No current facility-administered medications for this encounter.   Current Outpatient Medications:  .  atomoxetine (STRATTERA) 40 MG capsule, Take 40 mg by mouth daily., Disp: , Rfl:  .  baclofen (LIORESAL) 10 MG tablet, Take 10 mg by mouth 3 (three) times daily., Disp: , Rfl:  .  cholecalciferol (VITAMIN D) 1000 units tablet, Take 1,000 Units by mouth daily., Disp: , Rfl:  .  clonazePAM (KLONOPIN) 0.5 MG tablet, Take 0.5 mg by mouth 2 (two) times daily as needed for anxiety., Disp: , Rfl:  .  cycloSPORINE (RESTASIS) 0.05 % ophthalmic emulsion, 1 drop 2 (two) times daily., Disp: , Rfl:  .  etonogestrel (IMPLANON) 68 MG IMPL implant, Inject 1 each into the skin once., Disp: , Rfl:  .  ibuprofen (ADVIL,MOTRIN) 600 MG tablet, 1 every 8 hours as needed for pain, take after eating, Disp: 60 tablet, Rfl: 2 .  lurasidone (LATUDA) 40 MG TABS tablet, Take 40 mg by mouth daily with breakfast., Disp: , Rfl:  .  metroNIDAZOLE (FLAGYL) 500 MG tablet, Take 1 tablet (500 mg total) by mouth 2 (two) times daily., Disp: 14 tablet, Rfl: 0 .  Olopatadine HCl (PAZEO) 0.7 % SOLN, Apply to eye., Disp: , Rfl:    No Known Allergies  Past Medical History:  Diagnosis Date  . Anxiety    reports dx from previous MD  . Hypertension      Past Surgical History:  Procedure Laterality Date  . APPENDECTOMY    . CESAREAN SECTION      ROS  Objective:   Vitals: BP 129/88 (BP Location: Right Arm)   Pulse 70   Temp 98.9 F (37.2 C) (Oral)   Resp 18   SpO2 100%   Physical  Exam Constitutional:      General: She is not in acute distress.    Appearance: Normal appearance. She is well-developed. She is not ill-appearing.  HENT:     Head: Normocephalic and atraumatic.     Nose: Nose normal.     Mouth/Throat:     Mouth: Mucous membranes are moist.     Pharynx: Oropharynx is clear.  Eyes:     General: No scleral icterus.    Extraocular Movements: Extraocular movements intact.     Pupils: Pupils are equal, round, and reactive to light.  Cardiovascular:     Rate and Rhythm: Normal rate.  Pulmonary:     Effort: Pulmonary effort is normal.  Skin:    General: Skin is warm and dry.       Neurological:     General: No focal deficit present.     Mental Status: She is alert and oriented to person, place, and time.  Psychiatric:        Mood and Affect: Mood normal.        Behavior: Behavior normal.        Thought Content: Thought content normal.        Judgment: Judgment normal.      Assessment and Plan :   1. Hidradenitis  Patient is to start Keflex to address infected hidradenitis.  [facility]. Counseled patient on potential for adverse effects with medications prescribed/recommended today, ER and return-to-clinic precautions discussed, patient verbalized understanding.    Jaynee Eagles, [place] 07/26/19 1829

## 2019-07-27 DIAGNOSIS — F319 Bipolar disorder, unspecified: Secondary | ICD-10-CM | POA: Diagnosis not present

## 2019-07-28 DIAGNOSIS — F319 Bipolar disorder, unspecified: Secondary | ICD-10-CM | POA: Diagnosis not present

## 2019-08-03 DIAGNOSIS — F319 Bipolar disorder, unspecified: Secondary | ICD-10-CM | POA: Diagnosis not present

## 2019-08-10 DIAGNOSIS — F319 Bipolar disorder, unspecified: Secondary | ICD-10-CM | POA: Diagnosis not present

## 2019-08-17 DIAGNOSIS — F319 Bipolar disorder, unspecified: Secondary | ICD-10-CM | POA: Diagnosis not present

## 2019-08-25 DIAGNOSIS — F319 Bipolar disorder, unspecified: Secondary | ICD-10-CM | POA: Diagnosis not present

## 2019-08-31 DIAGNOSIS — F319 Bipolar disorder, unspecified: Secondary | ICD-10-CM | POA: Diagnosis not present

## 2019-09-07 DIAGNOSIS — F319 Bipolar disorder, unspecified: Secondary | ICD-10-CM | POA: Diagnosis not present

## 2019-09-14 DIAGNOSIS — F319 Bipolar disorder, unspecified: Secondary | ICD-10-CM | POA: Diagnosis not present

## 2019-09-21 DIAGNOSIS — F319 Bipolar disorder, unspecified: Secondary | ICD-10-CM | POA: Diagnosis not present

## 2019-09-22 DIAGNOSIS — F319 Bipolar disorder, unspecified: Secondary | ICD-10-CM | POA: Diagnosis not present

## 2019-10-05 DIAGNOSIS — F319 Bipolar disorder, unspecified: Secondary | ICD-10-CM | POA: Diagnosis not present

## 2019-10-12 DIAGNOSIS — F319 Bipolar disorder, unspecified: Secondary | ICD-10-CM | POA: Diagnosis not present

## 2019-10-13 DIAGNOSIS — F319 Bipolar disorder, unspecified: Secondary | ICD-10-CM | POA: Diagnosis not present

## 2019-10-19 DIAGNOSIS — F319 Bipolar disorder, unspecified: Secondary | ICD-10-CM | POA: Diagnosis not present

## 2019-10-26 DIAGNOSIS — F319 Bipolar disorder, unspecified: Secondary | ICD-10-CM | POA: Diagnosis not present

## 2019-10-30 HISTORY — PX: FOOT SURGERY: SHX648

## 2019-11-02 DIAGNOSIS — F319 Bipolar disorder, unspecified: Secondary | ICD-10-CM | POA: Diagnosis not present

## 2019-11-09 DIAGNOSIS — F319 Bipolar disorder, unspecified: Secondary | ICD-10-CM | POA: Diagnosis not present

## 2019-11-16 DIAGNOSIS — F319 Bipolar disorder, unspecified: Secondary | ICD-10-CM | POA: Diagnosis not present

## 2019-11-17 DIAGNOSIS — F319 Bipolar disorder, unspecified: Secondary | ICD-10-CM | POA: Diagnosis not present

## 2019-12-02 DIAGNOSIS — F319 Bipolar disorder, unspecified: Secondary | ICD-10-CM | POA: Diagnosis not present

## 2019-12-09 DIAGNOSIS — F319 Bipolar disorder, unspecified: Secondary | ICD-10-CM | POA: Diagnosis not present

## 2019-12-16 DIAGNOSIS — F319 Bipolar disorder, unspecified: Secondary | ICD-10-CM | POA: Diagnosis not present

## 2019-12-23 DIAGNOSIS — F319 Bipolar disorder, unspecified: Secondary | ICD-10-CM | POA: Diagnosis not present

## 2019-12-30 DIAGNOSIS — F319 Bipolar disorder, unspecified: Secondary | ICD-10-CM | POA: Diagnosis not present

## 2020-01-06 DIAGNOSIS — F319 Bipolar disorder, unspecified: Secondary | ICD-10-CM | POA: Diagnosis not present

## 2020-01-13 DIAGNOSIS — F319 Bipolar disorder, unspecified: Secondary | ICD-10-CM | POA: Diagnosis not present

## 2020-01-20 DIAGNOSIS — F319 Bipolar disorder, unspecified: Secondary | ICD-10-CM | POA: Diagnosis not present

## 2020-01-27 DIAGNOSIS — F319 Bipolar disorder, unspecified: Secondary | ICD-10-CM | POA: Diagnosis not present

## 2020-01-28 DIAGNOSIS — F319 Bipolar disorder, unspecified: Secondary | ICD-10-CM | POA: Diagnosis not present

## 2020-02-01 ENCOUNTER — Ambulatory Visit (INDEPENDENT_AMBULATORY_CARE_PROVIDER_SITE_OTHER): Payer: Medicaid Other

## 2020-02-01 ENCOUNTER — Other Ambulatory Visit: Payer: Self-pay

## 2020-02-01 ENCOUNTER — Ambulatory Visit: Payer: Medicaid Other | Admitting: Podiatry

## 2020-02-01 ENCOUNTER — Encounter: Payer: Self-pay | Admitting: Podiatry

## 2020-02-01 VITALS — BP 131/73 | HR 84 | Temp 97.9°F | Resp 14

## 2020-02-01 DIAGNOSIS — D492 Neoplasm of unspecified behavior of bone, soft tissue, and skin: Secondary | ICD-10-CM

## 2020-02-01 DIAGNOSIS — M79671 Pain in right foot: Secondary | ICD-10-CM

## 2020-02-01 DIAGNOSIS — S90851S Superficial foreign body, right foot, sequela: Secondary | ICD-10-CM

## 2020-02-01 DIAGNOSIS — Q828 Other specified congenital malformations of skin: Secondary | ICD-10-CM

## 2020-02-01 NOTE — Patient Instructions (Signed)
Pre-Operative Instructions  Congratulations, you have decided to take an important step towards improving your quality of life.  You can be assured that the doctors and staff at [facility] will be with you every step of the way.  Here are some important things you should know:  1. Plan to be at the surgery center/hospital at least 1 (one) hour prior to your scheduled time, unless otherwise directed by the surgical center/hospital staff.  You must have a responsible adult accompany you, remain during the surgery and drive you home.  Make sure you have directions to the surgical center/hospital to ensure you arrive on time. 2. If you are having surgery at [facility] or [facility], you will need a copy of your medical history and physical form from your family physician within one month prior to the date of surgery. We will give you a form for your primary physician to complete.  3. We make every effort to accommodate the date you request for surgery.  However, there are times where surgery dates or times have to be moved.  We will contact you as soon as possible if a change in schedule is required.   4. No aspirin/ibuprofen for one week before surgery.  If you are on aspirin, any non-steroidal anti-inflammatory medications (Mobic, Aleve, Ibuprofen) should not be taken seven (7) days prior to your surgery.  You make take Tylenol for pain prior to surgery.  5. Medications - If you are taking daily heart and blood pressure medications, seizure, reflux, allergy, asthma, anxiety, pain or diabetes medications, make sure you notify the surgery center/hospital before the day of surgery so they can tell you which medications you should take or avoid the day of surgery. 6. No food or drink after midnight the night before surgery unless directed otherwise by surgical center/hospital staff. 7. No alcoholic beverages 24-hours prior to surgery.  No smoking 24-hours prior or 24-hours after  surgery. 8. Wear loose pants or shorts. They should be loose enough to fit over bandages, boots, and casts. 9. Don't wear slip-on shoes. Sneakers are preferred. 10. Bring your boot with you to the surgery center/hospital.  Also bring crutches or a walker if your physician has prescribed it for you.  If you do not have this equipment, it will be provided for you after surgery. 11. If you have not been contacted by the surgery center/hospital by the day before your surgery, call to confirm the date and time of your surgery. 12. Leave-time from work may vary depending on the type of surgery you have.  Appropriate arrangements should be made prior to surgery with your employer. 13. Prescriptions will be provided immediately following surgery by your doctor.  Fill these as soon as possible after surgery and take the medication as directed. Pain medications will not be refilled on weekends and must be approved by the doctor. 14. Remove nail polish on the operative foot and avoid getting pedicures prior to surgery. 15. Wash the night before surgery.  The night before surgery wash the foot and leg well with water and the antibacterial soap provided. Be sure to pay special attention to beneath the toenails and in between the toes.  Wash for at least three (3) minutes. Rinse thoroughly with water and dry well with a towel.  Perform this wash unless told not to do so by your physician.  Enclosed: 1 Ice pack (please put in freezer the night before surgery)   1 Hibiclens skin cleaner     Pre-op instructions  If you have any questions regarding the instructions, please do not hesitate to call our office.  [place]: [address], , [place] [address] -- [phone]  [place]: [address] [place], [place] [address] -- [phone]  [place]: [address], [place], [place] [address] -- [phone]   Website: [email or web address] 

## 2020-02-01 NOTE — Progress Notes (Signed)
Subjective: 34 year old female presents the office today for concerns of recurrent callus, skin lesion to her right foot.  This started after she stepped on a nail in 2019.  She currently denies any redness or drainage or any swelling.  She was discussed options for the painful lesion at this point for possible surgery. Denies any systemic complaints such as fevers, chills, nausea, vomiting. No acute changes since last appointment, and no other complaints at this time.   Objective: AAO x3, NAD DP/PT pulses palpable bilaterally, CRT less than 3 seconds On the right foot just distal to the metatarsal heads plantarly is a thick hyperkeratotic lesion.  Upon debridement there is no underlying ulceration, drainage or any signs of infection no evidence of foreign body.  No open lesions or pre-ulcerative lesions.  No pain with calf compression, swelling, warmth, erythema  Assessment: Painful skin lesion right foot  Plan: -All treatment options discussed with the patient including all alternatives, risks, complications.  -X-rays obtained reviewed and skin marker was utilized to identify the skin lesion.  There is no evidence of foreign body, calcifications there is no evidence of acute fracture or osteomyelitis. -I debrided the lesion with any complications or bleeding.  We discussed with conservative as well as surgical treatment options.  At this point given her ongoing pain discussed with the excision of the area and biopsy.  Procedure risks she wishes to proceed.  We will plan to this once at the surgery center. -The incision placement as well as the postoperative course was discussed with the patient. I discussed risks of the surgery which include, but not limited to, infection, bleeding, pain, swelling, need for further surgery, delayed or nonhealing, painful or ugly scar, numbness or sensation changes, over/under correction, recurrence, transfer lesions, further deformity, recurrence of skin lesion,  DVT/PE, loss of toe/foot. Patient understands these risks and wishes to proceed with surgery. The surgical consent was reviewed with the patient all 3 pages were signed. No promises or guarantees were given to the outcome of the procedure. All questions were answered to the best of my ability. Before the surgery the patient was encouraged to call the office if there is any further questions. The surgery will be performed at the [facility] on an outpatient basis. -Patient encouraged to call the office with any questions, concerns, change in symptoms.   Trula Slade DPM

## 2020-02-03 ENCOUNTER — Encounter: Payer: Self-pay | Admitting: Podiatry

## 2020-02-03 ENCOUNTER — Other Ambulatory Visit: Payer: Self-pay | Admitting: Podiatry

## 2020-02-03 DIAGNOSIS — B070 Plantar wart: Secondary | ICD-10-CM | POA: Diagnosis not present

## 2020-02-03 DIAGNOSIS — D492 Neoplasm of unspecified behavior of bone, soft tissue, and skin: Secondary | ICD-10-CM

## 2020-02-03 DIAGNOSIS — K219 Gastro-esophageal reflux disease without esophagitis: Secondary | ICD-10-CM | POA: Diagnosis not present

## 2020-02-03 DIAGNOSIS — M25571 Pain in right ankle and joints of right foot: Secondary | ICD-10-CM | POA: Diagnosis not present

## 2020-02-03 MED ORDER — CEPHALEXIN 500 MG PO CAPS: 500.0000 mg | ORAL_CAPSULE | Freq: Three times a day (TID) | ORAL | 0 refills | Status: DC

## 2020-02-03 MED ORDER — HYDROCODONE-ACETAMINOPHEN 5-325 MG PO TABS: 1.0000 | ORAL_TABLET | Freq: Four times a day (QID) | ORAL | 0 refills | Status: DC | PRN

## 2020-02-03 MED ORDER — PROMETHAZINE HCL 25 MG PO TABS: 25.0000 mg | ORAL_TABLET | Freq: Three times a day (TID) | ORAL | 0 refills | Status: DC | PRN

## 2020-02-03 NOTE — Progress Notes (Signed)
Post-op medications sent to the pharmacy.  

## 2020-02-04 ENCOUNTER — Telehealth: Payer: Self-pay | Admitting: *Deleted

## 2020-02-04 NOTE — Telephone Encounter (Signed)
Called and spoke with the patient and I stated that I was calling to see how she was doing after having surgery on 02-03-2020 with Dr Jacqualyn Posey and patient stated that she was doing fine and there is not too much pain and does get nausea when taken the pain medicine and I stated to eat something or get some crackers and to ice and elevate and I stated to call the office if any concerns or questions and that we would see the patient next week for the post op visit. Nanie Dunkleberger

## 2020-02-09 ENCOUNTER — Encounter: Payer: Self-pay | Admitting: Podiatry

## 2020-02-09 ENCOUNTER — Ambulatory Visit (INDEPENDENT_AMBULATORY_CARE_PROVIDER_SITE_OTHER): Payer: Medicaid Other | Admitting: Podiatry

## 2020-02-09 ENCOUNTER — Other Ambulatory Visit: Payer: Self-pay

## 2020-02-09 VITALS — BP 120/71 | HR 101 | Temp 97.3°F | Resp 14

## 2020-02-09 DIAGNOSIS — D492 Neoplasm of unspecified behavior of bone, soft tissue, and skin: Secondary | ICD-10-CM

## 2020-02-10 DIAGNOSIS — F319 Bipolar disorder, unspecified: Secondary | ICD-10-CM | POA: Diagnosis not present

## 2020-02-10 NOTE — Progress Notes (Signed)
Subjective: Brianna Jordan is a 34 y.o. is seen today in office s/p right soft tissue mass excision preformed last week.  She states that she is doing well and she is off of pain medicine.  She still been wearing the cam boot.  The top of the foot was itching but this resolved once the bandage came off today.  No recent injury or falls. Denies any systemic complaints such as fevers, chills, nausea, vomiting. No calf pain, chest pain, shortness of breath.   Objective: General: No acute distress, AAOx3  DP/PT pulses palpable 2/4, CRT < 3 sec to all digits.  Protective sensation intact. Motor function intact.  RIGHT foot: Incision is well coapted without any evidence of dehiscence with sutures intact. There is no surrounding erythema, ascending cellulitis, fluctuance, crepitus, malodor, drainage/purulence. There is minimal edema around the surgical site. There is no significant pain along the surgical site.  No other areas of tenderness to bilateral lower extremities.  No other open lesions or pre-ulcerative lesions.  No pain with calf compression, swelling, warmth, erythema.   Assessment and Plan:  Status post right foot soft tissue mass excision, doing well with no complications   -Treatment options discussed including all alternatives, risks, and complications -Dressing was changed today.  Antibiotic ointment and dressing applied.  She can remove the bandage in 2 days insert to wash with soap and water and apply a similar bandage. -Continue cam boot -Ice/elevation -Pain medication as needed. -Monitor for any clinical signs or symptoms of infection and DVT/PE and directed to call the office immediately should any occur or go to the ER. -Follow-up as scheduled for possible suture removal or sooner if any problems arise. In the meantime, encouraged to call the office with any questions, concerns, change in symptoms.   Celesta Gentile, DPM

## 2020-02-11 ENCOUNTER — Telehealth: Payer: Self-pay | Admitting: *Deleted

## 2020-02-11 NOTE — Telephone Encounter (Signed)
I informed pt of Dr. Wagoner's review of results. 

## 2020-02-11 NOTE — Telephone Encounter (Signed)
-----   Message from Trula Slade, DPM sent at 02/09/2020  4:01 PM EDT ----- Can you please let her know the lesion was a wart. Thanks.

## 2020-02-17 DIAGNOSIS — F319 Bipolar disorder, unspecified: Secondary | ICD-10-CM | POA: Diagnosis not present

## 2020-02-18 ENCOUNTER — Ambulatory Visit (INDEPENDENT_AMBULATORY_CARE_PROVIDER_SITE_OTHER): Payer: Medicaid Other | Admitting: Podiatry

## 2020-02-18 ENCOUNTER — Other Ambulatory Visit: Payer: Self-pay

## 2020-02-18 VITALS — Temp 98.3°F

## 2020-02-18 DIAGNOSIS — D492 Neoplasm of unspecified behavior of bone, soft tissue, and skin: Secondary | ICD-10-CM

## 2020-02-18 NOTE — Progress Notes (Signed)
Subjective: Demetress Ambert is a 34 y.o. is seen today in office s/p right soft tissue mass excision preformed last week.  Overall states that she is doing well.  She presents today for suture removal.  She is doing the cam boot intravaginally which she does not know she is been on her feet quite a bit.  No other concerns today. Denies any systemic complaints such as fevers, chills, nausea, vomiting. No calf pain, chest pain, shortness of breath.   Objective: General: No acute distress, AAOx3  DP/PT pulses palpable 2/4, CRT < 3 sec to all digits.  Protective sensation intact. Motor function intact.  RIGHT foot: Incision is well coapted without any evidence of dehiscence with sutures intact. There is no surrounding erythema, ascending cellulitis, fluctuance, crepitus, malodor, drainage/purulence.  Faint edema.  There is no sign of infection.  After removal of the sutures the incision remained well coapted. No other open lesions or pre-ulcerative lesions.  No pain with calf compression, swelling, warmth, erythema.   Assessment and Plan:  Status post right foot soft tissue mass excision, doing well with no complications   -Treatment options discussed including all alternatives, risks, and complications -Sutures removed without complications.  Incision remained well coapted.  Steri-Strips applied for reinforcement followed by antibiotic ointment and a bandage.  She can start to shower and get the incision wet and dry thoroughly.  As she starts to feel better next week she can start to transition to regular shoe as tolerated along the incisions healing well.  Return in about 3 weeks (around 03/10/2020).  Trula Slade DPM

## 2020-02-24 DIAGNOSIS — F319 Bipolar disorder, unspecified: Secondary | ICD-10-CM | POA: Diagnosis not present

## 2020-03-03 ENCOUNTER — Encounter: Payer: Medicaid Other | Admitting: Podiatry

## 2020-03-04 DIAGNOSIS — F319 Bipolar disorder, unspecified: Secondary | ICD-10-CM | POA: Diagnosis not present

## 2020-03-10 ENCOUNTER — Ambulatory Visit (INDEPENDENT_AMBULATORY_CARE_PROVIDER_SITE_OTHER): Payer: Medicaid Other | Admitting: Podiatry

## 2020-03-10 ENCOUNTER — Other Ambulatory Visit: Payer: Self-pay

## 2020-03-10 ENCOUNTER — Encounter: Payer: Self-pay | Admitting: Podiatry

## 2020-03-10 DIAGNOSIS — D492 Neoplasm of unspecified behavior of bone, soft tissue, and skin: Secondary | ICD-10-CM

## 2020-03-14 ENCOUNTER — Other Ambulatory Visit: Payer: Self-pay

## 2020-03-14 ENCOUNTER — Encounter (HOSPITAL_COMMUNITY): Payer: Self-pay

## 2020-03-14 ENCOUNTER — Ambulatory Visit (HOSPITAL_COMMUNITY)
Admission: EM | Admit: 2020-03-14 | Discharge: 2020-03-14 | Disposition: A | Discharge status: Home / Self Care | Payer: Medicaid Other | Attending: Urgent Care | Admitting: Urgent Care

## 2020-03-14 DIAGNOSIS — R519 Headache, unspecified: Secondary | ICD-10-CM | POA: Diagnosis not present

## 2020-03-14 DIAGNOSIS — K047 Periapical abscess without sinus: Secondary | ICD-10-CM

## 2020-03-14 DIAGNOSIS — R220 Localized swelling, mass and lump, head: Secondary | ICD-10-CM

## 2020-03-14 MED ORDER — CLINDAMYCIN HCL 300 MG PO CAPS: 300.0000 mg | ORAL_CAPSULE | Freq: Three times a day (TID) | ORAL | 0 refills | Status: DC

## 2020-03-14 MED ORDER — NAPROXEN 500 MG PO TABS: 500.0000 mg | ORAL_TABLET | Freq: Two times a day (BID) | ORAL | 0 refills | Status: DC

## 2020-03-14 NOTE — ED Triage Notes (Signed)
Pt states she abscess on the left side of her face.

## 2020-03-14 NOTE — Discharge Instructions (Addendum)
[facility] [phone] extension [phone]  Dr. Civils [phone] [address]  [facility] [phone] [address]  [facility] [phone] extension [phone] [place], [place], [address] First come first serve for the first 10 clients.  May do simple extractions only, no wisdom teeth or surgery.  You may try the second for Thursday of the month starting at 6:30 AM.  [employer] You may call the school to see if they are still helping to provide dental care for emergent cases.  

## 2020-03-14 NOTE — ED Provider Notes (Signed)
[facility]   MRN: [id number] DOB: May 28, 1986  Subjective:   Brianna Jordan is a 34 y.o. female presenting for 5-day history of acute onset recurrent left-sided lower dental pain that has progressed to facial pain and swelling.  Patient was supposed to have dental extraction about a year ago but due to the pandemic, her appointment was canceled and she has not rescheduled.  She had leftover amoxicillin and took this over the weekend, was 500 mg, did 3 total doses.  Has used ibuprofen without relief.  No current facility-administered medications for this encounter.  Current Outpatient Medications:  .  atomoxetine (STRATTERA) 40 MG capsule, Take 40 mg by mouth daily., Disp: , Rfl:  .  cephALEXin (KEFLEX) 500 MG capsule, Take 1 capsule (500 mg total) by mouth 3 (three) times daily. (Patient not taking: Reported on 03/14/2020), Disp: 10 capsule, Rfl: 0 .  cycloSPORINE (RESTASIS) 0.05 % ophthalmic emulsion, 1 drop 2 (two) times daily., Disp: , Rfl:  .  etonogestrel (IMPLANON) 68 MG IMPL implant, Inject 1 each into the skin once., Disp: , Rfl:  .  HYDROcodone-acetaminophen (NORCO/VICODIN) 5-325 MG tablet, Take 1 tablet by mouth every 6 (six) hours as needed., Disp: 15 tablet, Rfl: 0 .  Olopatadine HCl (PAZEO) 0.7 % SOLN, Apply to eye., Disp: , Rfl:  .  ULTRAM 50 MG tablet, Take 50 mg by mouth every 6 (six) hours., Disp: , Rfl:    No Known Allergies  Past Medical History:  Diagnosis Date  . Anxiety    reports dx from previous MD  . Hypertension      Past Surgical History:  Procedure Laterality Date  . APPENDECTOMY    . CESAREAN SECTION      No family history on file.  Social History   Tobacco Use  . Smoking status: Current Every Day Smoker    Packs/day: 0.50    Types: Cigarettes, Cigars  . Smokeless tobacco: Never Used  Substance Use Topics  . Alcohol use: Yes    Comment: social  . Drug use: No    ROS   Objective:   Vitals: BP 127/87 (BP Location: Right Arm)    Pulse 77   Temp 98.6 F (37 C) (Oral)   Resp 18   Wt 253 lb (114.8 kg)   LMP 03/07/2020   SpO2 100%   BMI 40.84 kg/m   Physical Exam Constitutional:      General: She is not in acute distress.    Appearance: Normal appearance. She is well-developed. She is not ill-appearing, toxic-appearing or diaphoretic.  HENT:     Head: Normocephalic and atraumatic.      Nose: Nose normal.     Mouth/Throat:     Mouth: Mucous membranes are moist.     Pharynx: Oropharynx is clear.   Eyes:     General: No scleral icterus.       Right eye: No discharge.        Left eye: No discharge.     Extraocular Movements: Extraocular movements intact.     Pupils: Pupils are equal, round, and reactive to light.  Cardiovascular:     Rate and Rhythm: Normal rate.  Pulmonary:     Effort: Pulmonary effort is normal.  Skin:    General: Skin is warm and dry.  Neurological:     General: No focal deficit present.     Mental Status: She is alert and oriented to person, place, and time.  Psychiatric:  Mood and Affect: Mood normal.        Behavior: Behavior normal.        Thought Content: Thought content normal.        Judgment: Judgment normal.      Assessment and Plan :   PDMP not reviewed this encounter.  1. Facial pain   2. Dental abscess   3. Facial swelling     Start clindamycin for dental infection/abscess, use naproxen for pain and inflammation. Emphasized need for dental surgeon consult. Counseled patient on potential for adverse effects with medications prescribed/recommended today, strict ER and return-to-clinic precautions discussed, patient verbalized understanding.    Jaynee Eagles, PA-C 03/14/20 1302

## 2020-03-15 NOTE — Progress Notes (Signed)
Subjective: Brianna Jordan is a 34 y.o. is seen today in office s/p right soft tissue mass excision.  She states that she is doing well.  Some occasional discomfort at the incision but will continue to improve.  She is back to wearing regular shoe.  Denies any swelling or redness.  She has no other concerns today. Denies any systemic complaints such as fevers, chills, nausea, vomiting. No calf pain, chest pain, shortness of breath.   Objective: General: No acute distress, AAOx3  DP/PT pulses palpable 2/4, CRT < 3 sec to all digits.  Protective sensation intact. Motor function intact.  RIGHT foot: Incision is well coapted without any evidence of dehiscence and scars forming.  There is no surrounding erythema, ascending cellulitis, fluctuance, crepitus, malodor, drainage/purulence.  There is no significant edema.  There is no sign of infection.   No other open lesions or pre-ulcerative lesions.  No pain with calf compression, swelling, warmth, erythema.   Assessment and Plan:  Status post right foot soft tissue mass excision, doing well with no complications   -Treatment options discussed including all alternatives, risks, and complications -Incision appears to be healing well.  I want her to continue moisturizer to the area daily.  Dispensed offloading pad.  Continue regular shoe and gradually increase activity level.  Return in about 6 weeks (around 04/21/2020).  Trula Slade DPM

## 2020-04-21 ENCOUNTER — Other Ambulatory Visit: Payer: Self-pay

## 2020-04-21 ENCOUNTER — Ambulatory Visit (INDEPENDENT_AMBULATORY_CARE_PROVIDER_SITE_OTHER): Payer: Medicaid Other | Admitting: Podiatry

## 2020-04-21 DIAGNOSIS — D492 Neoplasm of unspecified behavior of bone, soft tissue, and skin: Secondary | ICD-10-CM

## 2020-04-25 NOTE — Progress Notes (Signed)
Subjective: Brianna Jordan is a 34 y.o. is seen today in office s/p right soft tissue mass excision.  She was doing well last appointment but today she presents frustrated as she feels that the lesion is coming back.  She still gets some discomfort in the area.  Denies any drainage or pus.  Occasional swelling but no redness or warmth.  She is also on her feet a lot and she thinks that may be contributing to her symptoms as she takes care of others. Denies any systemic complaints such as fevers, chills, nausea, vomiting. No calf pain, chest pain, shortness of breath.   Objective: General: No acute distress, AAOx3  DP/PT pulses palpable 2/4, CRT < 3 sec to all digits.  Protective sensation intact. Motor function intact.  RIGHT foot: Incision is well coapted without any evidence of dehiscence and scars forming.  On the incision there is an area of hyperkeratotic tissue which is able to debride.  This does not appear to be a reoccurrence of lesion but actually callus along incision.  There is mild swelling but there is no erythema or warmth.  There is no fluctuation or crepitation.   No other open lesions or pre-ulcerative lesions.  No pain with calf compression, swelling, warmth, erythema.   Assessment and Plan:  Status post right foot soft tissue mass excision, having discomfort  -Treatment options discussed including all alternatives, risks, and complications -I debrided the hyperkeratotic lesion today and complications or bleeding.  I tried to reassure her I do not think that the lesion is coming back but this is a new patient.  I dispensed gel metatarsal pads.  I will see her back next appointment likely applying salicylic acid to the callus, however I would like to make sure the incision is well-healed before proceeding.  Continue with supportive shoes to avoid pressure. -Incision appears to be healing well.  I want her to continue moisturizer to the area daily.  Dispensed offloading pad.   Continue regular shoe and gradually increase activity level.  Return in about 4 weeks (around 05/19/2020).  Trula Slade DPM

## 2020-05-19 ENCOUNTER — Ambulatory Visit (INDEPENDENT_AMBULATORY_CARE_PROVIDER_SITE_OTHER): Payer: Medicaid Other | Admitting: Podiatry

## 2020-05-19 ENCOUNTER — Other Ambulatory Visit: Payer: Self-pay

## 2020-05-19 DIAGNOSIS — D492 Neoplasm of unspecified behavior of bone, soft tissue, and skin: Secondary | ICD-10-CM | POA: Diagnosis not present

## 2020-05-19 MED ORDER — MELOXICAM 15 MG PO TABS: 15.0000 mg | ORAL_TABLET | Freq: Every day | ORAL | 0 refills | Status: DC

## 2020-05-19 NOTE — Patient Instructions (Addendum)
Keep the bandage on for 24 hours. At that time, remove and clean with soap and water. If it hurts or burns before 24 hours go ahead and remove the bandage and wash with soap and water. Keep the area clean. If there is any blistering cover with antibiotic ointment and a bandage. Monitor for any redness, drainage, or other signs of infection. Call the office if any are to occur. If you have any questions, please call the office at [phone].  Plantar Fasciitis (Heel Spur Syndrome) with Rehab The plantar fascia is a fibrous, ligament-like, soft-tissue structure that spans the bottom of the foot. Plantar fasciitis is a condition that causes pain in the foot due to inflammation of the tissue. SYMPTOMS  Pain and tenderness on the underneath side of the foot. Pain that worsens with standing or walking. CAUSES  Plantar fasciitis is caused by irritation and injury to the plantar fascia on the underneath side of the foot. Common mechanisms of injury include: Direct trauma to bottom of the foot. Damage to a small nerve that runs under the foot where the main fascia attaches to the heel bone. Stress placed on the plantar fascia due to bone spurs. RISK INCREASES WITH:  Activities that place stress on the plantar fascia (running, jumping, pivoting, or cutting). Poor strength and flexibility. Improperly fitted shoes. Tight calf muscles. Flat feet. Failure to warm-up properly before activity. Obesity. PREVENTION Warm up and stretch properly before activity. Allow for adequate recovery between workouts. Maintain physical fitness: Strength, flexibility, and endurance. Cardiovascular fitness. Maintain a health body weight. Avoid stress on the plantar fascia. Wear properly fitted shoes, including arch supports for individuals who have flat feet.  PROGNOSIS  If treated properly, then the symptoms of plantar fasciitis usually resolve without surgery. However, occasionally surgery is  necessary.  RELATED COMPLICATIONS  Recurrent symptoms that may result in a chronic condition. Problems of the lower back that are caused by compensating for the injury, such as limping. Pain or weakness of the foot during push-off following surgery. Chronic inflammation, scarring, and partial or complete fascia tear, occurring more often from repeated injections.  TREATMENT  Treatment initially involves the use of ice and medication to help reduce pain and inflammation. The use of strengthening and stretching exercises may help reduce pain with activity, especially stretches of the Achilles tendon. These exercises may be performed at home or with a therapist. Your caregiver may recommend that you use heel cups of arch supports to help reduce stress on the plantar fascia. Occasionally, corticosteroid injections are given to reduce inflammation. If symptoms persist for greater than 6 months despite non-surgical (conservative), then surgery may be recommended.   MEDICATION  If pain medication is necessary, then nonsteroidal anti-inflammatory medications, such as aspirin and ibuprofen, or other minor pain relievers, such as acetaminophen, are often recommended. Do not take pain medication within 7 days before surgery. Prescription pain relievers may be given if deemed necessary by your caregiver. Use only as directed and only as much as you need. Corticosteroid injections may be given by your caregiver. These injections should be reserved for the most serious cases, because they may only be given a certain number of times.  HEAT AND COLD Cold treatment (icing) relieves pain and reduces inflammation. Cold treatment should be applied for 10 to 15 minutes every 2 to 3 hours for inflammation and pain and immediately after any activity that aggravates your symptoms. Use ice packs or massage the area with a piece of ice (ice massage).   Heat treatment may be used prior to performing the stretching and  strengthening activities prescribed by your caregiver, physical therapist, or athletic trainer. Use a heat pack or soak the injury in warm water.  SEEK IMMEDIATE MEDICAL CARE IF: Treatment seems to offer no benefit, or the condition worsens. Any medications produce adverse side effects.  EXERCISES- RANGE OF MOTION (ROM) AND STRETCHING EXERCISES - Plantar Fasciitis (Heel Spur Syndrome) These exercises may help you when beginning to rehabilitate your injury. Your symptoms may resolve with or without further involvement from your physician, physical therapist or athletic trainer. While completing these exercises, remember:  Restoring tissue flexibility helps normal motion to return to the joints. This allows healthier, less painful movement and activity. An effective stretch should be held for at least 30 seconds. A stretch should never be painful. You should only feel a gentle lengthening or release in the stretched tissue.  RANGE OF MOTION - Toe Extension, Flexion Sit with your right / left leg crossed over your opposite knee. Grasp your toes and gently pull them back toward the top of your foot. You should feel a stretch on the bottom of your toes and/or foot. Hold this stretch for 10 seconds. Now, gently pull your toes toward the bottom of your foot. You should feel a stretch on the top of your toes and or foot. Hold this stretch for 10 seconds. Repeat  times. Complete this stretch 3 times per day.   RANGE OF MOTION - Ankle Dorsiflexion, Active Assisted Remove shoes and sit on a chair that is preferably not on a carpeted surface. Place right / left foot under knee. Extend your opposite leg for support. Keeping your heel down, slide your right / left foot back toward the chair until you feel a stretch at your ankle or calf. If you do not feel a stretch, slide your bottom forward to the edge of the chair, while still keeping your heel down. Hold this stretch for 10 seconds. Repeat 3 times.  Complete this stretch 2 times per day.   STRETCH  Gastroc, Standing Place hands on wall. Extend right / left leg, keeping the front knee somewhat bent. Slightly point your toes inward on your back foot. Keeping your right / left heel on the floor and your knee straight, shift your weight toward the wall, not allowing your back to arch. You should feel a gentle stretch in the right / left calf. Hold this position for 10 seconds. Repeat 3 times. Complete this stretch 2 times per day.  STRETCH  Soleus, Standing Place hands on wall. Extend right / left leg, keeping the other knee somewhat bent. Slightly point your toes inward on your back foot. Keep your right / left heel on the floor, bend your back knee, and slightly shift your weight over the back leg so that you feel a gentle stretch deep in your back calf. Hold this position for 10 seconds. Repeat 3 times. Complete this stretch 2 times per day.  STRETCH  Gastrocsoleus, Standing  Note: This exercise can place a lot of stress on your foot and ankle. Please complete this exercise only if specifically instructed by your caregiver.  Place the ball of your right / left foot on a step, keeping your other foot firmly on the same step. Hold on to the wall or a rail for balance. Slowly lift your other foot, allowing your body weight to press your heel down over the edge of the step. You should feel a stretch   in your right / left calf. Hold this position for 10 seconds. Repeat this exercise with a slight bend in your right / left knee. Repeat 3 times. Complete this stretch 2 times per day.   STRENGTHENING EXERCISES - Plantar Fasciitis (Heel Spur Syndrome)  These exercises may help you when beginning to rehabilitate your injury. They may resolve your symptoms with or without further involvement from your physician, physical therapist or athletic trainer. While completing these exercises, remember:  Muscles can gain both the endurance and the  strength needed for everyday activities through controlled exercises. Complete these exercises as instructed by your physician, physical therapist or athletic trainer. Progress the resistance and repetitions only as guided.  STRENGTH - Towel Curls Sit in a chair positioned on a non-carpeted surface. Place your foot on a towel, keeping your heel on the floor. Pull the towel toward your heel by only curling your toes. Keep your heel on the floor. Repeat 3 times. Complete this exercise 2 times per day.  STRENGTH - Ankle Inversion Secure one end of a rubber exercise band/tubing to a fixed object (table, pole). Loop the other end around your foot just before your toes. Place your fists between your knees. This will focus your strengthening at your ankle. Slowly, pull your big toe up and in, making sure the band/tubing is positioned to resist the entire motion. Hold this position for 10 seconds. Have your muscles resist the band/tubing as it slowly pulls your foot back to the starting position. Repeat 3 times. Complete this exercises 2 times per day.  Document Released: 10/15/2005 Document Revised: 01/07/2012 Document Reviewed: 01/27/2009 [employer] Patient Information 2014 [employer].  

## 2020-05-23 NOTE — Progress Notes (Signed)
Subjective: Brianna Jordan is a 34 y.o. is seen today in office s/p right soft tissue mass excision.  Overall she states that she is doing better with a metatarsal pad.  Pain is much improved and she seems to be in better spirits today.  She does get a callus on the area.  Denies any swelling increase or any redness or drainage or any open sores. Denies any systemic complaints such as fevers, chills, nausea, vomiting. No calf pain, chest pain, shortness of breath.   Objective: General: No acute distress, AAOx3  DP/PT pulses palpable 2/4, CRT < 3 sec to all digits.  Protective sensation intact. Motor function intact.  RIGHT foot: Incision is well coapted without any evidence of dehiscence and the scar is well formed.  There is hyperkeratotic tissue along the incision.  Upon debridement there is no underlying ulceration drainage or signs of infection no recurrence of the verruca.  Decreased tenderness to palpation today.  No areas of fluctuation or. No other open lesions or pre-ulcerative lesions.  No pain with calf compression, swelling, warmth, erythema.   Assessment and Plan:  Status post right foot soft tissue mass excision, having discomfort  -Treatment options discussed including all alternatives, risks, and complications -Brianna Jordan debrided the callus with any complications or bleeding.  The skin was cleaned with alcohol and a pad was placed followed by salicylic acid and a bandage.  Post procedure directions discussed.  Watch for any signs or symptoms of infection.  Return in about 4 weeks (around 06/16/2020).  Brianna Jordan DPM

## 2020-06-11 ENCOUNTER — Other Ambulatory Visit: Payer: Self-pay | Admitting: Podiatry

## 2020-06-20 ENCOUNTER — Encounter: Payer: Self-pay | Admitting: Podiatry

## 2020-06-20 ENCOUNTER — Ambulatory Visit (INDEPENDENT_AMBULATORY_CARE_PROVIDER_SITE_OTHER): Payer: Medicaid Other | Admitting: Podiatry

## 2020-06-20 ENCOUNTER — Other Ambulatory Visit: Payer: Self-pay

## 2020-06-20 DIAGNOSIS — M722 Plantar fascial fibromatosis: Secondary | ICD-10-CM | POA: Diagnosis not present

## 2020-06-20 DIAGNOSIS — D492 Neoplasm of unspecified behavior of bone, soft tissue, and skin: Secondary | ICD-10-CM | POA: Diagnosis not present

## 2020-06-22 NOTE — Progress Notes (Signed)
Subjective: 34 year old female presents the office today for follow-up evaluation after undergoing skin lesion excision on the right foot.  Also last appointment we had discussed plan fasciitis and plantar fascial brace dispensed.  She did see meloxicam as well and overall she has been doing better.  No recent injuries or falls or any other changes. Denies any systemic complaints such as fevers, chills, nausea, vomiting. No acute changes since last appointment, and no other complaints at this time.   Objective: AAO x3, NAD DP/PT pulses palpable bilaterally, CRT less than 3 seconds On the right foot on the plantar submetatarsal 5 on the prior surgery is mild hyperkeratotic tissue.  There is no underlying ulceration drainage or signs of infection.   On the left foot there is mild tenderness palpation on the plantar medial tubercle of the calcaneus at the insertion of plantar fascial.  The plantar fascial Achilles tendon appear to be intact.  No other areas of discomfort identified.  Negative Tinel sign. No pain with calf compression, swelling, warmth, erythema  Assessment: Callus right foot with improvement, improving left foot plantar fasciitis  Plan: -All treatment options discussed with the patient including all alternatives, risks, complications.  -Hyperkeratotic lesion sharply debrided without any complications or bleeding on the right foot.  Continue moisturizer daily -On the left foot continue stretching, icing daily as well as anti-inflammatories as needed.  Continue with plantar fascial brace but as she feels better she can discontinue this but encouraged good shoes with support.  Discussed orthotics. -Patient encouraged to call the office with any questions, concerns, change in symptoms.   Trula Slade DPM

## 2020-07-25 ENCOUNTER — Ambulatory Visit: Payer: Self-pay | Admitting: Obstetrics

## 2020-09-06 ENCOUNTER — Other Ambulatory Visit: Payer: Self-pay

## 2020-09-06 ENCOUNTER — Other Ambulatory Visit (HOSPITAL_COMMUNITY)
Admission: RE | Admit: 2020-09-06 | Discharge: 2020-09-06 | Disposition: A | Discharge status: Home / Self Care | Payer: Medicaid Other | Source: Ambulatory Visit | Attending: Nurse Practitioner | Admitting: Nurse Practitioner

## 2020-09-06 ENCOUNTER — Encounter: Payer: Self-pay | Admitting: Nurse Practitioner

## 2020-09-06 ENCOUNTER — Ambulatory Visit (INDEPENDENT_AMBULATORY_CARE_PROVIDER_SITE_OTHER): Payer: Medicaid Other | Admitting: Nurse Practitioner

## 2020-09-06 VITALS — BP 102/77 | HR 78 | Ht 66.0 in | Wt 267.0 lb

## 2020-09-06 DIAGNOSIS — Z113 Encounter for screening for infections with a predominantly sexual mode of transmission: Secondary | ICD-10-CM | POA: Insufficient documentation

## 2020-09-06 DIAGNOSIS — Z01419 Encounter for gynecological examination (general) (routine) without abnormal findings: Secondary | ICD-10-CM | POA: Diagnosis not present

## 2020-09-06 DIAGNOSIS — Z6841 Body Mass Index (BMI) 40.0 and over, adult: Secondary | ICD-10-CM

## 2020-09-06 DIAGNOSIS — R61 Generalized hyperhidrosis: Secondary | ICD-10-CM

## 2020-09-06 DIAGNOSIS — Z975 Presence of (intrauterine) contraceptive device: Secondary | ICD-10-CM | POA: Diagnosis not present

## 2020-09-06 NOTE — Progress Notes (Signed)
Patient presents for Annual Exam today.  LMP: N/A  Last pap: 10/16/18 WNL  Pt states she has no Hx of abnormal paps Contraception: Nexplanon Inserted 11/13/2018. STD Screening: Desires Full panel  Family Hx of Breast Cancer: None   CC: Hot flashes, Night sweats, vaginal dryness. Pt states she is prone to BV infections. Has been drinking probiotics.  Wants to discuss if there is a issue with  hormones

## 2020-09-06 NOTE — Patient Instructions (Signed)
Enso cream for under your breasts

## 2020-09-06 NOTE — Progress Notes (Signed)
GYNECOLOGY ANNUAL PREVENTATIVE CARE ENCOUNTER NOTE  Subjective:   Brianna Jordan is a 34 y.o. [id number] female here for a routine annual gynecologic exam.  Current complaints: hot flashes, night sweats and sometimes vaginal dryness.   Denies abnormal vaginal bleeding, discharge, pelvic pain, problems with intercourse or other gynecologic concerns.    Gynecologic History No LMP recorded. Patient has had an implant. Contraception: Nexplanon Last Pap: 10-16-18. Results were: normal  Obstetric History OB History  Gravida Para Term Preterm AB Living  2 1 1     2   SAB TAB Ectopic Multiple Live Births          2    # Outcome Date GA Lbr Len/2nd Weight Sex Delivery Anes PTL Lv  2 Term 08/17/10     CS-LTranv   LIV  1 Gravida 04/23/06     Vag-Spont   LIV    Past Medical History:  Diagnosis Date   Anxiety    reports dx from previous MD   Hypertension     Past Surgical History:  Procedure Laterality Date   APPENDECTOMY     CESAREAN SECTION     FOOT SURGERY  2021    No current outpatient medications on file prior to visit.   No current facility-administered medications on file prior to visit.    No Known Allergies  Social History   Socioeconomic History   Marital status: Married    Spouse name: Not on file   Number of children: Not on file   Years of education: Not on file   Highest education level: Not on file  Occupational History   Not on file  Tobacco Use   Smoking status: Former Smoker    Types: Cigarettes   Smokeless tobacco: Never Used  Scientific laboratory technician Use: Never used  Substance and Sexual Activity   Alcohol use: Yes    Comment: social   Drug use: No   Sexual activity: Yes    Birth control/protection: Implant  Other Topics Concern   Not on file  Social History Narrative   Not on file   Social Determinants of Health   Financial Resource Strain:    Difficulty of Paying Living Expenses: Not on file  Food Insecurity:     Worried About Charity fundraiser in the Last Year: Not on file   [employer] of Food in the Last Year: Not on file  Transportation Needs:    Lack of Transportation (Medical): Not on file   Lack of Transportation (Non-Medical): Not on file  Physical Activity:    Days of Exercise per Week: Not on file   Minutes of Exercise per Session: Not on file  Stress:    Feeling of Stress : Not on file  Social Connections:    Frequency of Communication with Friends and Family: Not on file   Frequency of Social Gatherings with Friends and Family: Not on file   Attends Religious Services: Not on file   Active Member of Clubs or Organizations: Not on file   Attends Archivist Meetings: Not on file   Marital Status: Not on file  Intimate Partner Violence:    Fear of Current or Ex-Partner: Not on file   Emotionally Abused: Not on file   Physically Abused: Not on file   Sexually Abused: Not on file    History reviewed. No pertinent family history.  The following portions of the patient's history were reviewed and updated as appropriate: allergies,  current medications, past family history, past medical history, past social history, past surgical history and problem list.  Review of Systems Pertinent items noted in HPI and remainder of comprehensive ROS otherwise negative.   Objective:  BP 102/77    Pulse 78    Ht 5\' 6"  (1.676 m)    Wt 267 lb (121.1 kg)    BMI 43.09 kg/m  CONSTITUTIONAL: Well-developed, well-nourished female in no acute distress.  HENT:  Normocephalic, atraumatic, External right and left ear normal.  EYES: Conjunctivae and EOM are normal. Pupils are equal, round.  No scleral icterus.  NECK: Normal range of motion, supple, no masses.  Normal thyroid.  SKIN: Skin is warm and dry. No rash noted. Not diaphoretic. No erythema. No pallor. NEUROLOGIC: Alert and oriented to person, place, and time. Normal reflexes, muscle tone coordination. No cranial nerve deficit  noted. PSYCHIATRIC: Normal mood and affect. Normal behavior. Normal judgment and thought content. CARDIOVASCULAR: Normal heart rate noted, regular rhythm RESPIRATORY: Clear to auscultation bilaterally. Effort and breath sounds normal, no problems with respiration noted. BREASTS: Symmetric in size. No masses, skin changes, nipple drainage, or lymphadenopathy.  Some hyperpigmentation under left breast ABDOMEN: Soft, no distention noted.  No tenderness, rebound or guarding.  PELVIC: Normal appearing external genitalia; normal appearing vaginal mucosa and cervix.  No abnormal discharge noted.  Normal uterine size, no other palpable masses, no uterine or adnexal tenderness. MUSCULOSKELETAL: Normal range of motion. No tenderness.  No cyanosis, clubbing, or edema.    Assessment and Plan:  1. Encounter for annual routine gynecological examination Advised OTC Enso cream for discoloration under her breast Pap not due. Has some hot flashes - not perspiring but feels hot periodically  - CBC - Comprehensive metabolic panel  2. Screening for STD (sexually transmitted disease)  - Cervicovaginal ancillary only( [place]) - Hepatitis B surface antigen - Hepatitis C antibody - HIV Antibody (routine testing w rflx) - RPR  3. BMI 40.0-44.9, adult ([place]) Advised BMI be under 25 to decrease risk of HTN and DM Client agrees she will try increased exercise and healthy food choices.  4. Nexplanon in place Some irregular spotting and reassured this is normal.  5. Night sweats  - TSH  Routine preventative health maintenance measures emphasized. Please refer to After Visit Summary for other counseling recommendations.    Earlie Server, RN, MSN, NP-BC Nurse Practitioner, [place] for [facility]

## 2020-09-07 LAB — COMPREHENSIVE METABOLIC PANEL
ALT: 13 IU/L (ref 0–32)
AST: 19 IU/L (ref 0–40)
Albumin/Globulin Ratio: 1.1 — ABNORMAL LOW (ref 1.2–2.2)
Albumin: 4.4 g/dL (ref 3.8–4.8)
Alkaline Phosphatase: 52 IU/L (ref 44–121)
BUN/Creatinine Ratio: 12 (ref 9–23)
BUN: 11 mg/dL (ref 6–20)
Bilirubin Total: 0.3 mg/dL (ref 0.0–1.2)
CO2: 22 mmol/L (ref 20–29)
Calcium: 9.4 mg/dL (ref 8.7–10.2)
Chloride: 102 mmol/L (ref 96–106)
Creatinine, Ser: 0.91 mg/dL (ref 0.57–1.00)
GFR calc Af Amer: 94 mL/min/{1.73_m2} (ref >59)
GFR calc non Af Amer: 81 mL/min/{1.73_m2} (ref >59)
Globulin, Total: 4 g/dL (ref 1.5–4.5)
Glucose: 100 mg/dL — ABNORMAL HIGH (ref 65–99)
Potassium: 4 mmol/L (ref 3.5–5.2)
Sodium: 137 mmol/L (ref 134–144)
Total Protein: 8.4 g/dL (ref 6.0–8.5)

## 2020-09-07 LAB — CERVICOVAGINAL ANCILLARY ONLY
Bacterial Vaginitis (gardnerella): POSITIVE — AB
Candida Glabrata: NEGATIVE
Candida Vaginitis: NEGATIVE
Chlamydia: NEGATIVE
Comment: NEGATIVE
Comment: NEGATIVE
Comment: NEGATIVE
Comment: NEGATIVE
Comment: NEGATIVE
Comment: NORMAL
Neisseria Gonorrhea: NEGATIVE
Trichomonas: NEGATIVE

## 2020-09-07 LAB — CBC
Hematocrit: 36.2 % (ref 34.0–46.6)
Hemoglobin: 11.8 g/dL (ref 11.1–15.9)
MCH: 28.4 pg (ref 26.6–33.0)
MCHC: 32.6 g/dL (ref 31.5–35.7)
MCV: 87 fL (ref 79–97)
Platelets: 274 10*3/uL (ref 150–450)
RBC: 4.16 x10E6/uL (ref 3.77–5.28)
RDW: 12.8 % (ref 11.7–15.4)
WBC: 6.1 10*3/uL (ref 3.4–10.8)

## 2020-09-07 LAB — HEPATITIS C ANTIBODY: Hep C Virus Ab: <0.1 s/co ratio (ref 0.0–0.9)

## 2020-09-07 LAB — HEPATITIS B SURFACE ANTIGEN: Hepatitis B Surface Ag: NEGATIVE

## 2020-09-07 LAB — TSH: TSH: 23.8 u[IU]/mL — ABNORMAL HIGH (ref 0.450–4.500)

## 2020-09-07 LAB — HIV ANTIBODY (ROUTINE TESTING W REFLEX): HIV Screen 4th Generation wRfx: NONREACTIVE

## 2020-09-07 LAB — RPR: RPR Ser Ql: NONREACTIVE

## 2020-09-07 MED ORDER — METRONIDAZOLE 0.75 % VA GEL: 1.0000 | Freq: Every day | VAGINAL | 0 refills | Status: AC

## 2020-09-07 NOTE — Addendum Note (Signed)
Addended by: Virginia Rochester on: 09/07/2020 12:39 PM   Modules accepted: Orders

## 2020-09-12 ENCOUNTER — Telehealth: Payer: Self-pay

## 2020-09-12 NOTE — Telephone Encounter (Signed)
Patient has been informed of test result and will follow up with her pcp.

## 2020-09-12 NOTE — Telephone Encounter (Signed)
-----   Message from Virginia Rochester, NP sent at 09/07/2020  8:11 AM EST ----- Please call client.  Her thyroid tests were abnormal and that could be causing her problems of hot flashes and night sweats.  She will need to see a PCP for further evaluation and management.  She should try to see them soon or go to the mobile health clinic ASAP.

## 2020-10-27 DIAGNOSIS — Z03818 Encounter for observation for suspected exposure to other biological agents ruled out: Secondary | ICD-10-CM | POA: Diagnosis not present

## 2021-03-15 ENCOUNTER — Telehealth: Payer: Self-pay | Admitting: Family Medicine

## 2021-03-15 NOTE — Telephone Encounter (Signed)
  Medicaid Managed Care   Unsuccessful Outreach Note  03/15/2021 Name: Brianna Jordan MRN: [id number] DOB: 26-May-1986  Referred by: Antony Blackbird, MD (Inactive) Reason for referral : High Risk Managed Medicaid (Attempted to reach patient today to get her scheduled for a phone visit with the [facility] team. VM was full so I could not leave a message.)   An unsuccessful telephone outreach was attempted today. The patient was referred to the case management team for assistance with care management and care coordination.   Follow Up Plan: The care management team will reach out to the patient again over the next 7-14 days.   [place]

## 2021-04-24 ENCOUNTER — Other Ambulatory Visit: Payer: Self-pay

## 2021-04-24 ENCOUNTER — Ambulatory Visit (HOSPITAL_COMMUNITY)
Admission: EM | Admit: 2021-04-24 | Discharge: 2021-04-24 | Disposition: A | Discharge status: Home / Self Care | Payer: Medicaid Other

## 2021-04-24 ENCOUNTER — Encounter (HOSPITAL_COMMUNITY): Payer: Self-pay | Admitting: Emergency Medicine

## 2021-04-24 DIAGNOSIS — S8391XA Sprain of unspecified site of right knee, initial encounter: Secondary | ICD-10-CM | POA: Diagnosis not present

## 2021-04-24 DIAGNOSIS — M25561 Pain in right knee: Secondary | ICD-10-CM | POA: Diagnosis not present

## 2021-04-24 DIAGNOSIS — S8391XD Sprain of unspecified site of right knee, subsequent encounter: Secondary | ICD-10-CM | POA: Diagnosis not present

## 2021-04-24 MED ORDER — IBUPROFEN 800 MG PO TABS: 800.0000 mg | ORAL_TABLET | Freq: Three times a day (TID) | ORAL | 0 refills | Status: DC | PRN

## 2021-04-24 NOTE — ED Provider Notes (Addendum)
[place]    CSN: [id number] Arrival date & time: 04/24/21  0865      History   Chief Complaint Chief Complaint  Patient presents with   Knee Pain    HPI Brianna Jordan is a 35 y.o. female.   Patient states that she squatted to pick up trash last night and felt a pain in right knee directly after. Pain is present in medial portion of right knee. Is able to bear weight but states that it is painful to bear weight. Patient states that the position that pain is most severe is the sitting position, specifically when "sitting on a toilet." Denies any past injuries to right knee.    Knee Pain  Past Medical History:  Diagnosis Date   Anxiety    reports dx from previous MD   Hypertension     Patient Active Problem List   Diagnosis Date Noted   Nexplanon in place 09/06/2020   BMI 40.0-44.9, adult ([place]) 09/06/2020    Past Surgical History:  Procedure Laterality Date   APPENDECTOMY     CESAREAN SECTION     FOOT SURGERY  2021    OB History     Gravida  2   Para  1   Term  1   Preterm      AB      Living  2      SAB      IAB      Ectopic      Multiple      Live Births  2            Home Medications    Prior to Admission medications   Medication Sig Start Date End Date Taking? Authorizing Provider  acetaminophen (TYLENOL) 325 MG tablet Take 650 mg by mouth every 6 (six) hours as needed.   Yes [provider]  ibuprofen (ADVIL) 800 MG tablet Take 1 tablet (800 mg total) by mouth 3 (three) times daily as needed for mild pain or moderate pain. 04/24/21  Yes Odis Luster, FNP    Family History History reviewed. No pertinent family history.  Social History Social History   Tobacco Use   Smoking status: Former    Pack years: 0.00    Types: Cigarettes   Smokeless tobacco: Never  Vaping Use   Vaping Use: Never used  Substance Use Topics   Alcohol use: Yes    Comment: social   Drug use: No     Allergies    Patient has no known allergies.   Review of Systems Review of Systems Per HPI  Physical Exam Triage Vital Signs ED Triage Vitals  Enc Vitals Group     BP 04/24/21 0940 115/80     Pulse Rate 04/24/21 0940 67     Resp 04/24/21 0940 20     Temp 04/24/21 0940 99 F (37.2 C)     Temp Source 04/24/21 0940 Oral     SpO2 04/24/21 0940 96 %     Weight --      Height --      Head Circumference --      Peak Flow --      Pain Score 04/24/21 0936 5     Pain Loc --      Pain Edu? --      Excl. in [place]? --    No data found.  Updated Vital Signs BP 115/80 (BP Location: Right Arm) Comment (BP Location): large cuff  Pulse 67   Temp 99 F (37.2 C) (Oral)   Resp 20   SpO2 96%   Visual Acuity Right Eye Distance:   Left Eye Distance:   Bilateral Distance:    Right Eye Near:   Left Eye Near:    Bilateral Near:     Physical Exam   UC Treatments / Results  Labs (all labs ordered are listed, but only abnormal results are displayed) Labs Reviewed - No data to display  EKG   Radiology No results found.  Procedures Procedures (including critical care time)  Medications Ordered in UC Medications - No data to display  Initial Impression / Assessment and Plan / UC Course  I have reviewed the triage vital signs and the nursing notes.  Pertinent labs & imaging results that were available during my care of the patient were reviewed by me and considered in my medical decision making (see chart for details).     Patient was provided a right knee brace for stability of joint until further evaluated by orthopedist. Prescription strength ibuprofen as needed for right knee pain and to help reduce inflammation. Patient was advised to not take any additional ibuprofen or NSAID's with current prescription. Ice application to right knee. Discussed limitations of knee x-ray with patient. Patient voiced understanding. Orthopedist referral for further evaluation and management. Discussed  strict return precautions. Patient verbalized understanding and is agreeable with plan.  Final Clinical Impressions(s) / UC Diagnoses   Final diagnoses:  Sprain of right knee, unspecified ligament, initial encounter  Acute pain of right knee     Discharge Instructions      Please call orthopedist today for further evaluation and management of knee pain.   Please take prescription for ibuprofen as needed. Do not take more than prescribed or any other over the counter medications containing ibuprofen, Aleve, or Advil. Please use ice application for 15 minutes at a time 2-3 times daily.   Wear knee brace daily until otherwise instructed by orthopedist. Do not sleep in knee brace.      ED Prescriptions     Medication Sig Dispense Auth. Provider   ibuprofen (ADVIL) 800 MG tablet Take 1 tablet (800 mg total) by mouth 3 (three) times daily as needed for mild pain or moderate pain. 21 tablet Odis Luster, FNP      PDMP not reviewed this encounter.   Odis Luster, FNP 04/24/21 [place], [place], FNP 04/24/21 [place], [place], FNP 04/24/21 1027    Odis Luster, FNP 04/24/21 1158

## 2021-04-24 NOTE — Discharge Instructions (Addendum)
Please call orthopedist today for further evaluation and management of knee pain.   Please take prescription for ibuprofen as needed. Do not take more than prescribed or any other over the counter medications containing ibuprofen, Aleve, or Advil. Please use ice application for 15 minutes at a time 2-3 times daily.   Wear knee brace daily until otherwise instructed by orthopedist. Do not sleep in knee brace.

## 2021-04-24 NOTE — ED Triage Notes (Signed)
Patient squatted to pick up something. Then when she stood up, felt pain, pulling feeling in right knee.    Bending of right knee is painful, weight bearing is painful

## 2021-04-27 ENCOUNTER — Ambulatory Visit (INDEPENDENT_AMBULATORY_CARE_PROVIDER_SITE_OTHER): Payer: Medicaid Other

## 2021-04-27 ENCOUNTER — Ambulatory Visit (INDEPENDENT_AMBULATORY_CARE_PROVIDER_SITE_OTHER): Payer: Medicaid Other | Admitting: Orthopedic Surgery

## 2021-04-27 DIAGNOSIS — M25561 Pain in right knee: Secondary | ICD-10-CM

## 2021-05-02 DIAGNOSIS — M25561 Pain in right knee: Secondary | ICD-10-CM | POA: Diagnosis not present

## 2021-05-02 MED ORDER — METHYLPREDNISOLONE ACETATE 40 MG/ML IJ SUSP
40.0000 mg | INTRAMUSCULAR | Status: AC | PRN
  Administered 2021-05-02: 40 mg via INTRA_ARTICULAR

## 2021-05-02 MED ORDER — LIDOCAINE HCL (PF) 1 % IJ SOLN
5.0000 mL | INTRAMUSCULAR | Status: AC | PRN
  Administered 2021-05-02: 5 mL

## 2021-05-02 NOTE — Progress Notes (Signed)
Office Visit Note   Patient: Brianna Jordan           Date of Birth: [birth year]           MRN: [id number] Visit Date: 04/27/2021              Requested by: No referring provider defined for this encounter. PCP: Antony Blackbird, MD (Inactive)  Chief Complaint  Patient presents with   Right Knee - Pain    Follow up ER 04/24/21      HPI: Patient is a 35 year old woman who is seen for initial evaluation for acute right knee pain.  Patient states that she was squatting to pick up trash and had acute onset of medial joint line pain.  She is currently wearing a brace she has been taking ibuprofen 800 mg as needed.  Patient was initially seen at urgent care on 04/24/2021 and is seen for initial evaluation at this time.  Assessment & Plan: Visit Diagnoses:  1. Acute pain of right knee     Plan: Right knee was injected she will increase her activities as tolerated may use the ibuprofen as needed if the pain recurs.  Follow-Up Instructions: Return if symptoms worsen or fail to improve.   Ortho Exam  Patient is alert, oriented, no adenopathy, well-dressed, normal affect, normal respiratory effort. Examination patient has an antalgic gait she has full range of motion of the right knee there is no effusion patella tracks midline she has full extension collaterals and cruciates are stable.  Medial lateral joint lines are minimally tender to palpation.  There is no redness no cellulitis no ulcers.  Imaging: No results found. No images are attached to the encounter.  Labs: No results found for: HGBA1C, ESRSEDRATE, CRP, LABURIC, REPTSTATUS, GRAMSTAIN, CULT, LABORGA   Lab Results  Component Value Date   ALBUMIN 4.4 09/06/2020   ALBUMIN 4.4 07/10/2018   ALBUMIN 4.0 06/25/2014    No results found for: MG No results found for: VD25OH  No results found for: PREALBUMIN CBC EXTENDED Latest Ref Rng & Units 09/06/2020 07/10/2018 06/25/2014  WBC 3.4 - 10.8 x10E3/uL 6.1 6.0 7.4  RBC 3.77 - 5.28  x10E6/uL 4.16 4.04 4.31  HGB 11.1 - 15.9 g/dL 11.8 11.2 12.5  HCT 34.0 - 46.6 % 36.2 35.0 37.0  PLT 150 - 450 x10E3/uL 274 288 265  NEUTROABS 1.4 - 7.0 x10E3/uL - 3.0 3.6  LYMPHSABS 0.7 - 3.1 x10E3/uL - 2.3 3.1     There is no height or weight on file to calculate BMI.  Orders:  Orders Placed This Encounter  Procedures   XR Knee 1-2 Views Right   No orders of the defined types were placed in this encounter.    Procedures: Large Joint Inj: R knee on 05/02/2021 10:20 AM Indications: pain and diagnostic evaluation Details: 22 G 1.5 in needle, anteromedial approach  Arthrogram: No  Medications: 5 mL lidocaine (PF) 1 %; 40 mg methylPREDNISolone acetate 40 MG/ML Outcome: tolerated well, no immediate complications Procedure, treatment alternatives, risks and benefits explained, specific risks discussed. Consent was given by the patient. Immediately prior to procedure a time out was called to verify the correct patient, procedure, equipment, support staff and site/side marked as required. Patient was prepped and draped in the usual sterile fashion.     Clinical Data: No additional findings.  ROS:  All other systems negative, except as noted in the HPI. Review of Systems  Objective: Vital Signs: There were no vitals taken for this  visit.  Specialty Comments:  No specialty comments available.  PMFS History: Patient Active Problem List   Diagnosis Date Noted   Nexplanon in place 09/06/2020   BMI 40.0-44.9, adult ([place]) 09/06/2020   Past Medical History:  Diagnosis Date   Anxiety    reports dx from previous MD   Hypertension     No family history on file.  Past Surgical History:  Procedure Laterality Date   APPENDECTOMY     CESAREAN SECTION     FOOT SURGERY  2021   Social History   Occupational History   Not on file  Tobacco Use   Smoking status: Former    Pack years: 0.00    Types: Cigarettes   Smokeless tobacco: Never  Vaping Use   Vaping Use: Never used   Substance and Sexual Activity   Alcohol use: Yes    Comment: social   Drug use: No   Sexual activity: Yes    Birth control/protection: Implant

## 2021-05-18 ENCOUNTER — Ambulatory Visit (INDEPENDENT_AMBULATORY_CARE_PROVIDER_SITE_OTHER): Payer: Medicaid Other | Admitting: Physician Assistant

## 2021-05-18 ENCOUNTER — Other Ambulatory Visit: Payer: Self-pay

## 2021-05-18 DIAGNOSIS — M25561 Pain in right knee: Secondary | ICD-10-CM | POA: Diagnosis not present

## 2021-05-18 NOTE — Progress Notes (Signed)
Office Visit Note   Patient: Brianna Jordan           Date of Birth: [birth year]           MRN: [id number] Visit Date: 05/18/2021              Requested by: No referring provider defined for this encounter. PCP: Antony Blackbird, MD (Inactive)  Chief Complaint  Patient presents with   Right Knee - Pain      HPI: Patient presents today for follow-up on her right knee injection.  She is 3 weeks since the injection.  She really has not had any significant improvement.  She actually says she is having more symptoms of popping and giving way of her knee.  Assessment & Plan: Visit Diagnoses:  1. Acute pain of right knee     Plan: Patient has failed conservative treatment she does have mechanical symptoms concerning for meniscus tear we will order an MRI follow-up afterwards with Dr. Sharol Given  Follow-Up Instructions: No follow-ups on file.   Ortho Exam  Patient is alert, oriented, no adenopathy, well-dressed, normal affect, normal respiratory effort. Examination of her knee she has no cellulitis no effusion she has no pain with terminal extension good endpoint on anterior draw she does have some tenderness over the medial lateral joint line no varus or valgus instability  Imaging: No results found. No images are attached to the encounter.  Labs: No results found for: HGBA1C, ESRSEDRATE, CRP, LABURIC, REPTSTATUS, GRAMSTAIN, CULT, LABORGA   Lab Results  Component Value Date   ALBUMIN 4.4 09/06/2020   ALBUMIN 4.4 07/10/2018   ALBUMIN 4.0 06/25/2014    No results found for: MG No results found for: VD25OH  No results found for: PREALBUMIN CBC EXTENDED Latest Ref Rng & Units 09/06/2020 07/10/2018 06/25/2014  WBC 3.4 - 10.8 x10E3/uL 6.1 6.0 7.4  RBC 3.77 - 5.28 x10E6/uL 4.16 4.04 4.31  HGB 11.1 - 15.9 g/dL 11.8 11.2 12.5  HCT 34.0 - 46.6 % 36.2 35.0 37.0  PLT 150 - 450 x10E3/uL 274 288 265  NEUTROABS 1.4 - 7.0 x10E3/uL - 3.0 3.6  LYMPHSABS 0.7 - 3.1 x10E3/uL - 2.3 3.1      There is no height or weight on file to calculate BMI.  Orders:  Orders Placed This Encounter  Procedures   MR Knee Right w/o contrast   No orders of the defined types were placed in this encounter.    Procedures: No procedures performed  Clinical Data: No additional findings.  ROS:  All other systems negative, except as noted in the HPI. Review of Systems  Objective: Vital Signs: There were no vitals taken for this visit.  Specialty Comments:  No specialty comments available.  PMFS History: Patient Active Problem List   Diagnosis Date Noted   Nexplanon in place 09/06/2020   BMI 40.0-44.9, adult ([place]) 09/06/2020   Past Medical History:  Diagnosis Date   Anxiety    reports dx from previous MD   Hypertension     No family history on file.  Past Surgical History:  Procedure Laterality Date   APPENDECTOMY     CESAREAN SECTION     FOOT SURGERY  2021   Social History   Occupational History   Not on file  Tobacco Use   Smoking status: Former    Types: Cigarettes   Smokeless tobacco: Never  Vaping Use   Vaping Use: Never used  Substance and Sexual Activity   Alcohol use: Yes  Comment: social   Drug use: No   Sexual activity: Yes    Birth control/protection: Implant

## 2021-05-25 ENCOUNTER — Ambulatory Visit (HOSPITAL_COMMUNITY)
Admission: RE | Admit: 2021-05-25 | Discharge: 2021-05-25 | Disposition: A | Discharge status: Home / Self Care | Payer: Medicaid Other | Source: Ambulatory Visit | Attending: Physician Assistant | Admitting: Physician Assistant

## 2021-05-25 ENCOUNTER — Other Ambulatory Visit: Payer: Self-pay

## 2021-05-25 DIAGNOSIS — M25561 Pain in right knee: Secondary | ICD-10-CM | POA: Insufficient documentation

## 2021-05-29 ENCOUNTER — Ambulatory Visit (INDEPENDENT_AMBULATORY_CARE_PROVIDER_SITE_OTHER): Payer: Medicaid Other | Admitting: Orthopedic Surgery

## 2021-05-29 ENCOUNTER — Encounter: Payer: Self-pay | Admitting: Orthopedic Surgery

## 2021-05-29 DIAGNOSIS — M25561 Pain in right knee: Secondary | ICD-10-CM

## 2021-05-29 NOTE — Addendum Note (Signed)
Addended by: Meridee Score on: 05/29/2021 09:39 AM   Modules accepted: Orders

## 2021-05-29 NOTE — Progress Notes (Addendum)
Office Visit Note   Patient: Brianna Jordan           Date of Birth: 03-18-1986           MRN: [id number] Visit Date: 05/29/2021              Requested by: No referring provider defined for this encounter. PCP: Antony Blackbird, MD (Inactive)  Chief Complaint  Patient presents with   Right Knee - Pain      HPI: Patient is a 35 year old woman who presents with persistent right knee pain she is status post an MRI scan.  Assessment & Plan: Visit Diagnoses:  1. Acute pain of right knee     Plan: We will set patient up with physical therapy for quad and hamstring strengthening to further stabilize her knee.  Reevaluate in 2 months to see how she is progressing with therapy.  Follow-Up Instructions: Return in about 2 months (around 07/29/2021).   Ortho Exam  Patient is alert, oriented, no adenopathy, well-dressed, normal affect, normal respiratory effort. Examination patient has crepitation of the patellofemoral joint with range of motion.  There is no effusion.  Review of the MRI scan shows an intact ACL PCL and collateral ligaments.  Patient does have some degenerative changes in the medial meniscus but no full-thickness meniscal tear.  There is some bursitis at the pes anserine  Imaging: No results found. No images are attached to the encounter.  Labs: No results found for: HGBA1C, ESRSEDRATE, CRP, LABURIC, REPTSTATUS, GRAMSTAIN, CULT, LABORGA   Lab Results  Component Value Date   ALBUMIN 4.4 09/06/2020   ALBUMIN 4.4 07/10/2018   ALBUMIN 4.0 06/25/2014    No results found for: MG No results found for: VD25OH  No results found for: PREALBUMIN CBC EXTENDED Latest Ref Rng & Units 09/06/2020 07/10/2018 06/25/2014  WBC 3.4 - 10.8 x10E3/uL 6.1 6.0 7.4  RBC 3.77 - 5.28 x10E6/uL 4.16 4.04 4.31  HGB 11.1 - 15.9 g/dL 11.8 11.2 12.5  HCT 34.0 - 46.6 % 36.2 35.0 37.0  PLT 150 - 450 x10E3/uL 274 288 265  NEUTROABS 1.4 - 7.0 x10E3/uL - 3.0 3.6  LYMPHSABS 0.7 - 3.1 x10E3/uL - 2.3  3.1     There is no height or weight on file to calculate BMI.  Orders:  No orders of the defined types were placed in this encounter.  No orders of the defined types were placed in this encounter.    Procedures: No procedures performed  Clinical Data: No additional findings.  ROS:  All other systems negative, except as noted in the HPI. Review of Systems  Objective: Vital Signs: There were no vitals taken for this visit.  Specialty Comments:  No specialty comments available.  PMFS History: Patient Active Problem List   Diagnosis Date Noted   Nexplanon in place 09/06/2020   BMI 40.0-44.9, adult ([place]) 09/06/2020   Past Medical History:  Diagnosis Date   Anxiety    reports dx from previous MD   Hypertension     History reviewed. No pertinent family history.  Past Surgical History:  Procedure Laterality Date   APPENDECTOMY     CESAREAN SECTION     FOOT SURGERY  2021   Social History   Occupational History   Not on file  Tobacco Use   Smoking status: Former    Types: Cigarettes   Smokeless tobacco: Never  Vaping Use   Vaping Use: Never used  Substance and Sexual Activity   Alcohol use: Yes  Comment: social   Drug use: No   Sexual activity: Yes    Birth control/protection: Implant

## 2021-06-12 ENCOUNTER — Encounter: Payer: Self-pay | Admitting: Orthopedic Surgery

## 2021-06-12 ENCOUNTER — Other Ambulatory Visit: Payer: Self-pay

## 2021-06-12 ENCOUNTER — Ambulatory Visit (INDEPENDENT_AMBULATORY_CARE_PROVIDER_SITE_OTHER): Payer: Medicaid Other | Admitting: Orthopedic Surgery

## 2021-06-12 DIAGNOSIS — M25561 Pain in right knee: Secondary | ICD-10-CM | POA: Diagnosis not present

## 2021-06-12 NOTE — Progress Notes (Signed)
Office Visit Note   Patient: Brianna Jordan           Date of Birth: 02/12/1986           MRN: [id number] Visit Date: 06/12/2021              Requested by: No referring provider defined for this encounter. PCP: Antony Blackbird, MD (Inactive)  Chief Complaint  Patient presents with   Right Knee - Follow-up      HPI: Patient is a 35 year old woman who presents in follow-up for right knee pain.  Physical therapy was ordered on her last visit but she has not been contacted by therapy yet.  Patient states she has increasing pain with changes in barometric pressure.  Patient occasionally uses a hinged knee brace.  MRI scan showed no full-thickness meniscal or cartilage defect she did have partial tearing of the medial meniscus.  Assessment & Plan: Visit Diagnoses:  1. Acute pain of right knee     Plan: We will recontact physical therapy reevaluate in 4 weeks.  Follow-Up Instructions: Return in about 4 weeks (around 07/10/2021).   Ortho Exam  Patient is alert, oriented, no adenopathy, well-dressed, normal affect, normal respiratory effort. Examination patient has full range of motion of the right knee collaterals and cruciates are stable the medial and lateral joint lines are nontender to palpation there is no effusion she has crepitation in the patellofemoral joint with range of motion and this reproduces her symptoms.  Imaging: No results found. No images are attached to the encounter.  Labs: No results found for: HGBA1C, ESRSEDRATE, CRP, LABURIC, REPTSTATUS, GRAMSTAIN, CULT, LABORGA   Lab Results  Component Value Date   ALBUMIN 4.4 09/06/2020   ALBUMIN 4.4 07/10/2018   ALBUMIN 4.0 06/25/2014    No results found for: MG No results found for: VD25OH  No results found for: PREALBUMIN CBC EXTENDED Latest Ref Rng & Units 09/06/2020 07/10/2018 06/25/2014  WBC 3.4 - 10.8 x10E3/uL 6.1 6.0 7.4  RBC 3.77 - 5.28 x10E6/uL 4.16 4.04 4.31  HGB 11.1 - 15.9 g/dL 11.8 11.2 12.5  HCT  34.0 - 46.6 % 36.2 35.0 37.0  PLT 150 - 450 x10E3/uL 274 288 265  NEUTROABS 1.4 - 7.0 x10E3/uL - 3.0 3.6  LYMPHSABS 0.7 - 3.1 x10E3/uL - 2.3 3.1     There is no height or weight on file to calculate BMI.  Orders:  No orders of the defined types were placed in this encounter.  No orders of the defined types were placed in this encounter.    Procedures: No procedures performed  Clinical Data: No additional findings.  ROS:  All other systems negative, except as noted in the HPI. Review of Systems  Objective: Vital Signs: There were no vitals taken for this visit.  Specialty Comments:  No specialty comments available.  PMFS History: Patient Active Problem List   Diagnosis Date Noted   Nexplanon in place 09/06/2020   BMI 40.0-44.9, adult ([place]) 09/06/2020   Past Medical History:  Diagnosis Date   Anxiety    reports dx from previous MD   Hypertension     History reviewed. No pertinent family history.  Past Surgical History:  Procedure Laterality Date   APPENDECTOMY     CESAREAN SECTION     FOOT SURGERY  2021   Social History   Occupational History   Not on file  Tobacco Use   Smoking status: Former    Types: Cigarettes   Smokeless tobacco: Never  Vaping Use   Vaping Use: Never used  Substance and Sexual Activity   Alcohol use: Yes    Comment: social   Drug use: No   Sexual activity: Yes    Birth control/protection: Implant

## 2021-06-22 ENCOUNTER — Encounter: Payer: Self-pay | Admitting: Physical Therapy

## 2021-06-22 ENCOUNTER — Ambulatory Visit: Payer: Medicaid Other | Attending: Orthopedic Surgery | Admitting: Physical Therapy

## 2021-06-22 ENCOUNTER — Other Ambulatory Visit: Payer: Self-pay

## 2021-06-22 DIAGNOSIS — R600 Localized edema: Secondary | ICD-10-CM | POA: Diagnosis present

## 2021-06-22 DIAGNOSIS — M6281 Muscle weakness (generalized): Secondary | ICD-10-CM | POA: Insufficient documentation

## 2021-06-22 DIAGNOSIS — R2689 Other abnormalities of gait and mobility: Secondary | ICD-10-CM | POA: Diagnosis present

## 2021-06-22 DIAGNOSIS — M25661 Stiffness of right knee, not elsewhere classified: Secondary | ICD-10-CM | POA: Diagnosis present

## 2021-06-22 DIAGNOSIS — M25561 Pain in right knee: Secondary | ICD-10-CM | POA: Diagnosis present

## 2021-06-22 NOTE — Patient Instructions (Signed)
Access Code: [facility] URL: [email or web address] Date: 06/22/2021 Prepared by: Hilda Blades  Exercises Supine Quad Set - 2 x daily - 7 x weekly - 2 sets - 10 reps - 5 hold Active Straight Leg Raise with Quad Set - 2 x daily - 7 x weekly - 3 sets - 5-8 reps Supine Bridge - 1 x daily - 7 x weekly - 3 sets - 6 reps Clamshell with Resistance - 1 x daily - 7 x weekly - 3 sets - 15 reps Prone Knee Flexion AROM - 1 x daily - 7 x weekly - 3 sets - 10 reps

## 2021-06-22 NOTE — Therapy (Signed)
[place], [place], [address] Phone: [phone]   Fax:  [phone]  Physical Therapy Evaluation  Patient Details  Name: Brianna Jordan MRN: [id number] Date of Birth: [birth year] Referring Provider (PT): Newt Minion, MD   Encounter Date: 06/22/2021   PT End of Session - 06/22/21 0917     Visit Number 1    Number of Visits 16    Date for PT Re-Evaluation 08/17/21    Authorization Type [employer] [place]    Progress Note Due on Visit 27    PT Start Time 0835    PT Stop Time 0915    PT Time Calculation (min) 40 min    Activity Tolerance Patient tolerated treatment well    Behavior During Therapy [facility] for tasks assessed/performed             Past Medical History:  Diagnosis Date   Anxiety    reports dx from previous MD   Hypertension     Past Surgical History:  Procedure Laterality Date   APPENDECTOMY     CESAREAN SECTION     FOOT SURGERY  2021    There were no vitals filed for this visit.    Subjective Assessment - 06/22/21 0837     Subjective Patient reports she went to go pick something up off the floor and her right knee pooped causing a sharp pain and swelling. States that she had imaging and an injection in the right knee. Patient reports that her knee definitely feels weak and she has trouble putting all her weight on the right leg which causes her to fall back in the chair when she stands up, she does feel some popping in the knee. Sometimes she feels like she is stretching a muscle in the front of her shin when she points her toe. She feels like she has limited her activity a little but typically will push through the pain. When she gets a pain it is typically sharp and will go away quickly, also will have more achiness with any rainy weather.    Limitations Standing;Walking;House hold activities    How long can you walk comfortably? "states she hasn't pushed herself, can walk around grocery  store but does get tired"    Patient Stated Goals Want to strengthen the knee and keep it from getting worse    Currently in Pain? Yes    Pain Score 0-No pain   7-8/10 at worst   Pain Location Knee    Pain Orientation Right    Pain Descriptors / Indicators Sharp;Tiring    Pain Type Acute pain    Pain Onset More than a month ago    Pain Frequency Intermittent    Aggravating Factors  Standing or walking extended periods, putting all her weight on the right knee    Pain Relieving Factors Rest                [facility] PT Assessment - 06/22/21 0001       Assessment   Medical Diagnosis Acute pain of right knee    Referring Provider (PT) Newt Minion, MD    Onset Date/Surgical Date --   July 2022   Next MD Visit 07/11/2021    Prior Therapy None      Precautions   Precautions None      Restrictions   Weight Bearing Restrictions No      Balance Screen   Has the patient fallen in the  past 6 months No    Has the patient had a decrease in activity level because of a fear of falling?  No    Is the patient reluctant to leave their home because of a fear of falling?  No      Home Ecologist residence    Living Arrangements Children    Home Access Stairs to enter    Entrance Stairs-Number of Steps 1    [place] One level      Prior Function   Level of Independence Independent    Vocation Unemployed      Cognition   Overall Cognitive Status Within Functional Limits for tasks assessed      Observation/Other Assessments   Observations Patient appears in no apparent distress    Focus on Therapeutic Outcomes (FOTO)  NA - MCD      Observation/Other Assessments-Edema    Edema Circumferential      Circumferential Edema   Circumferential - Right Not officially measure but patient exhibits observable swelling compared to opposite side      Sensation   Light Touch Appears Intact      Coordination   Gross Motor Movements are Fluid and Coordinated  Yes      Functional Tests   Functional tests Squat;Single leg stance      Squat   Comments Patient exhibits left weight shift with decreased depth, decreased right knee bend with increased pain      Single Leg Stance   Comments Unable on right      ROM / Strength   AROM / PROM / Strength AROM;Strength      AROM   Overall AROM Comments Right knee flexion limited by pain    AROM Assessment Site Knee    Right/Left Knee Right;Left    Right Knee Extension 10   hyper   Right Knee Flexion 110    Left Knee Extension 10   hyper   Left Knee Flexion 125      Strength   Strength Assessment Site Hip;Knee    Right/Left Hip Right;Left    Right Hip Flexion 4/5    Right Hip Extension 3/5    Right Hip ABduction 3-/5    Left Hip Flexion 4/5    Left Hip Extension 3+/5    Left Hip ABduction 3+/5    Right/Left Knee Right;Left    Right Knee Flexion 4+/5    Right Knee Extension 4/5    Left Knee Flexion 5/5    Left Knee Extension 5/5      Flexibility   Soft Tissue Assessment /Muscle Length yes    Hamstrings Slightly limited on right    Quadriceps WFL - patient reports increase pull on right      Palpation   Patella mobility WFL    Palpation comment Non-TTP      Transfers   Transfers Independent with all Transfers      Ambulation/Gait   Ambulation/Gait Yes    Ambulation/Gait Assistance 7: Independent    Gait Comments Patient exhibits slightly antalgic gait on right                        Objective measurements completed on examination: See above findings.       [facility] Adult PT Treatment/Exercise - 06/22/21 0001       Exercises   Exercises Knee/Hip      Knee/Hip Exercises: Supine   Quad Sets 10 reps  5 sec hold   Quad Sets Limitations towel roll under knee with focus on elevating heel off table    Bridges 5 reps    Straight Leg Raises 5 reps      Knee/Hip Exercises: Sidelying   Clams x 15 with red band      Knee/Hip Exercises: Prone   Hamstring Curl 10  reps                    PT Education - 06/22/21 0911     Education Details Exam findings, POC, HEP    Person(s) Educated Patient    Methods Explanation;Demonstration;Verbal cues    Comprehension Verbalized understanding;Returned demonstration;Verbal cues required;Need further instruction              PT Short Term Goals - 06/22/21 0930       PT SHORT TERM GOAL #1   Title Patient will be I with initial HEP to progress with PT    Baseline HEP provided at evaluation    Time 4    Period Weeks    Status New    Target Date 07/20/21      PT SHORT TERM GOAL #2   Title Patient will demonstrate >/= 120 deg right knee flexion to improve improve transfers without pain    Baseline 110 deg    Time 4    Period Weeks    Status New    Target Date 07/20/21      PT SHORT TERM GOAL #3   Title Patient will demonstrate ambulation without antalgic gait pattern on right to reduce pain and improve mobility    Baseline patient exhibits antalgic gait on right    Time 4    Period Weeks    Status New    Target Date 07/20/21               PT Long Term Goals - 06/22/21 0934       PT LONG TERM GOAL #1   Title Patient will be I with final HEP to maintain progress with PT    Baseline HEP provided at eval    Time 8    Period Weeks    Status New    Target Date 08/17/21      PT LONG TERM GOAL #2   Title Patient will demonstrate 5/5 MMT right knee to improve stair negotiation    Baseline right knee extension 4/5 MMT, knee flexion 4+/5 MMT    Time 8    Period Weeks    Status New    Target Date 08/17/21      PT LONG TERM GOAL #3   Title Patient will report no increase in pain or limitations with walking to improve community access and grocery shopping    Baseline patient reports increased pain and fatigue walking around grocery store    Time 8    Period Weeks    Status New    Target Date 08/17/21      PT LONG TERM GOAL #4   Title Patient will perform proper squat  technique without increase in pain in order to return to exercise and perform household tasks    Baseline patient exhibits left weight shift with reduced right knee bend, increase pain with squatting    Time 8    Period Weeks    Status New    Target Date 08/17/21  Plan - 06/22/21 0919     Clinical Impression Statement Patient present to PT with acute right knee pain, where she felt a pop while performing a squatting maneuver. Currently patient exhibis limiation in right knee flexion motion, gross weakness of the right knee, bilateral hip weakness, pain and limitation with weight bearing activities such as squatting and stairs. Patient was provided exercises to initiate gradual strengthening for the right knee and hips, and was instructed on pain and swelling management for the knee. Patient would benefit from continued skilled PT to progress her motion and strength in order to improve weight bearing tasks such as walking, squats, and stairs, and maximize her functional ability.    Personal Factors and Comorbidities Fitness    Examination-Activity Limitations Locomotion Level;Squat;Stairs;Stand;Lift    Examination-Participation Restrictions Cleaning;Community Activity;Shop    Stability/Clinical Decision Making Stable/Uncomplicated    Clinical Decision Making Low    Rehab Potential Good    PT Frequency 2x / week    PT Duration 8 weeks    PT Treatment/Interventions ADLs/Self Care Home Management;Aquatic Therapy;Cryotherapy;Electrical Stimulation;Iontophoresis '4mg'$ /ml Dexamethasone;Moist Heat;Traction;Ultrasound;Neuromuscular re-education;Balance training;Therapeutic exercise;Therapeutic activities;Functional mobility training;Stair training;Gait training;Patient/family education;Manual techniques;Dry needling;Passive range of motion;Taping;Vasopneumatic Device;Spinal Manipulations;Joint Manipulations    PT Next Visit Plan Review HEP and progress PRN, continue OKC  strengthening progressing to CKC strengthening as tolerate, balance training    PT Home Exercise Plan [place] and Agree with Plan of Care Patient             Patient will benefit from skilled therapeutic intervention in order to improve the following deficits and impairments:  Abnormal gait, Decreased range of motion, Difficulty walking, Decreased strength, Decreased balance, Improper body mechanics, Impaired flexibility, Decreased activity tolerance, Pain, Increased edema  Visit Diagnosis: Acute pain of right knee  Stiffness of right knee, not elsewhere classified  Muscle weakness (generalized)  Other abnormalities of gait and mobility  Localized edema     Problem List Patient Active Problem List   Diagnosis Date Noted   Nexplanon in place 09/06/2020   BMI 40.0-44.9, adult ([place]) 09/06/2020    Hilda Blades, PT, DPT, LAT, ATC 06/22/21  9:50 AM Phone: [phone] Fax: [facility] [address] [place], [place], [address] Phone: [phone]   Fax:  [phone]  Name: Jolean Prosen MRN: [id number] Date of Birth: Jun 08, 1986

## 2021-06-27 ENCOUNTER — Other Ambulatory Visit: Payer: Self-pay

## 2021-06-27 ENCOUNTER — Ambulatory Visit: Payer: Medicaid Other

## 2021-06-27 DIAGNOSIS — M25561 Pain in right knee: Secondary | ICD-10-CM

## 2021-06-27 DIAGNOSIS — M6281 Muscle weakness (generalized): Secondary | ICD-10-CM

## 2021-06-27 DIAGNOSIS — R2689 Other abnormalities of gait and mobility: Secondary | ICD-10-CM

## 2021-06-27 DIAGNOSIS — M25661 Stiffness of right knee, not elsewhere classified: Secondary | ICD-10-CM

## 2021-06-27 DIAGNOSIS — R600 Localized edema: Secondary | ICD-10-CM

## 2021-06-27 NOTE — Therapy (Signed)
[place], [place], [address] Phone: [phone]   Fax:  [phone]  Physical Therapy Treatment  Patient Details  Name: Brianna Jordan MRN: [id number] Date of Birth: February 22, 1986 Referring Provider (PT): Newt Minion, MD   Encounter Date: 06/27/2021   PT End of Session - 06/27/21 1124     Visit Number 2    Number of Visits 16    Date for PT Re-Evaluation 08/17/21    Authorization Type UHC [place]    Progress Note Due on Visit 27    PT Start Time 1131    PT Stop Time 1211    PT Time Calculation (min) 40 min    Activity Tolerance Patient tolerated treatment well    Behavior During Therapy [facility] for tasks assessed/performed             Past Medical History:  Diagnosis Date   Anxiety    reports dx from previous MD   Hypertension     Past Surgical History:  Procedure Laterality Date   APPENDECTOMY     CESAREAN SECTION     FOOT SURGERY  2021    There were no vitals filed for this visit.   Subjective Assessment - 06/27/21 1125     Subjective Pt presents to PT with continued reports of R knee pain and discomfort. She has been compliant with her HEP with no adverse effect, just some muscle soreness. 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 She is ready to begin PT at this time.    Currently in Pain? Yes    Pain Score 5     Pain Location Knee    Pain Orientation Right           [facility] Adult PT Treatment/Exercise:   Therapeutic Exercise:  NuStep lvl 5 x 3 min UE/LE while taking subjective Quad set x 10 - 5 sec hold ea Supine SLR 2x10 Bridge 2x10 - 3 sec hold S/L clamshell GTB LAQ 2x10 2lbs STS x 10 no UE support - elevated table                             PT Short Term Goals - 06/22/21 0930       PT SHORT TERM GOAL #1   Title Patient will be I with initial HEP to progress with PT    Baseline  HEP provided at evaluation    Time 4    Period Weeks    Status New    Target Date 07/20/21      PT SHORT TERM GOAL #2   Title Patient will demonstrate >/= 120 deg right knee flexion to improve improve transfers without pain    Baseline 110 deg    Time 4    Period Weeks    Status New    Target Date 07/20/21      PT SHORT TERM GOAL #3   Title Patient will demonstrate ambulation without antalgic gait pattern on right to reduce pain and improve mobility    Baseline patient exhibits antalgic gait on right    Time 4    Period Weeks    Status New    Target Date 07/20/21               PT Long Term Goals - 06/22/21 0934       PT LONG  TERM GOAL #1   Title Patient will be I with final HEP to maintain progress with PT    Baseline HEP provided at eval    Time 8    Period Weeks    Status New    Target Date 08/17/21      PT LONG TERM GOAL #2   Title Patient will demonstrate 5/5 MMT right knee to improve stair negotiation    Baseline right knee extension 4/5 MMT, knee flexion 4+/5 MMT    Time 8    Period Weeks    Status New    Target Date 08/17/21      PT LONG TERM GOAL #3   Title Patient will report no increase in pain or limitations with walking to improve community access and grocery shopping    Baseline patient reports increased pain and fatigue walking around grocery store    Time 8    Period Weeks    Status New    Target Date 08/17/21      PT LONG TERM GOAL #4   Title Patient will perform proper squat technique without increase in pain in order to return to exercise and perform household tasks    Baseline patient exhibits left weight shift with reduced right knee bend, increase pain with squatting    Time 8    Period Weeks    Status New    Target Date 08/17/21                   Plan - 06/27/21 1136     Clinical Impression Statement Pt was able to complete prescribed exercises with no increase in baseline pain. Today we continued to focus on  increasing quad and proximal hip muscle strength in order to decrease knee pain and improve functional mobility. Pt continues to benefit from skilled PT services and will continue to be seen and progressed per POC.    PT Treatment/Interventions ADLs/Self Care Home Management;Aquatic Therapy;Cryotherapy;Electrical Stimulation;Iontophoresis '4mg'$ /ml Dexamethasone;Moist Heat;Traction;Ultrasound;Neuromuscular re-education;Balance training;Therapeutic exercise;Therapeutic activities;Functional mobility training;Stair training;Gait training;Patient/family education;Manual techniques;Dry needling;Passive range of motion;Taping;Vasopneumatic Device;Spinal Manipulations;Joint Manipulations    PT Next Visit Plan Review HEP and progress PRN, continue OKC strengthening progressing to CKC strengthening as tolerate, balance training    PT Home Exercise Plan [facility]             Patient will benefit from skilled therapeutic intervention in order to improve the following deficits and impairments:  Abnormal gait, Decreased range of motion, Difficulty walking, Decreased strength, Decreased balance, Improper body mechanics, Impaired flexibility, Decreased activity tolerance, Pain, Increased edema  Visit Diagnosis: Acute pain of right knee  Stiffness of right knee, not elsewhere classified  Muscle weakness (generalized)  Other abnormalities of gait and mobility  Localized edema     Problem List Patient Active Problem List   Diagnosis Date Noted   Nexplanon in place 09/06/2020   BMI 40.0-44.9, adult ([place]) 09/06/2020    Ward Chatters, PT, DPT 06/27/21 12:11 PM  [facility] [address] [place], [place], [address] Phone: [phone]   Fax:  [phone]  Name: Brianna Jordan MRN: [id number] Date of Birth: August 24, 1986

## 2021-07-05 ENCOUNTER — Other Ambulatory Visit: Payer: Self-pay

## 2021-07-05 ENCOUNTER — Ambulatory Visit: Payer: Medicaid Other | Attending: Orthopedic Surgery

## 2021-07-05 DIAGNOSIS — R2689 Other abnormalities of gait and mobility: Secondary | ICD-10-CM | POA: Diagnosis present

## 2021-07-05 DIAGNOSIS — M6281 Muscle weakness (generalized): Secondary | ICD-10-CM | POA: Insufficient documentation

## 2021-07-05 DIAGNOSIS — M25561 Pain in right knee: Secondary | ICD-10-CM | POA: Insufficient documentation

## 2021-07-05 DIAGNOSIS — M25661 Stiffness of right knee, not elsewhere classified: Secondary | ICD-10-CM | POA: Insufficient documentation

## 2021-07-05 DIAGNOSIS — R600 Localized edema: Secondary | ICD-10-CM | POA: Diagnosis present

## 2021-07-05 NOTE — Therapy (Signed)
[place], [place], [address] Phone: [phone]   Fax:  [phone]  Physical Therapy Treatment  Patient Details  Name: Brianna Jordan MRN: [id number] Date of Birth: [birth year] Referring Provider (PT): Newt Minion, MD   Encounter Date: 07/05/2021   PT End of Session - 07/05/21 0910     Visit Number 3    Number of Visits 16    Date for PT Re-Evaluation 08/17/21    Authorization Type UHC [place]    Progress Note Due on Visit 27    PT Start Time 0911    PT Stop Time 0953    PT Time Calculation (min) 42 min    Activity Tolerance Patient tolerated treatment well    Behavior During Therapy [facility] for tasks assessed/performed             Past Medical History:  Diagnosis Date   Anxiety    reports dx from previous MD   Hypertension     Past Surgical History:  Procedure Laterality Date   APPENDECTOMY     CESAREAN SECTION     FOOT SURGERY  2021    There were no vitals filed for this visit.   Subjective Assessment - 07/05/21 0910     Subjective Pt presents to PT with no reports of R knee pain. She notes that she had increased pain a few days ago when it was raining. Pt has been frustrated about the bills and copays, especially when she will get bills a fews days/weeks past the visit date. She is ready to begin PT at this time.    Currently in Pain? No/denies    Pain Score 0-No pain           [facility] Adult PT Treatment/Exercise:   Therapeutic Exercise:  NuStep lvl 5 x 3 min UE/LE while taking subjective Quad set x 10  (not today) Supine SLR 2x10 ea Bridge w/ clamshell 2x10 - 3 sec hold S/L clamshell BTB 2x10 ea LAQ 2x10 3lbs STS 2 x 10 no UE support - elevated table Standing hip abd/ext 2x10 ea                               PT Short Term Goals - 06/22/21 0930       PT SHORT TERM GOAL #1   Title Patient will be I with initial HEP to progress with PT    Baseline  HEP provided at evaluation    Time 4    Period Weeks    Status New    Target Date 07/20/21      PT SHORT TERM GOAL #2   Title Patient will demonstrate >/= 120 deg right knee flexion to improve improve transfers without pain    Baseline 110 deg    Time 4    Period Weeks    Status New    Target Date 07/20/21      PT SHORT TERM GOAL #3   Title Patient will demonstrate ambulation without antalgic gait pattern on right to reduce pain and improve mobility    Baseline patient exhibits antalgic gait on right    Time 4    Period Weeks    Status New    Target Date 07/20/21               PT Long Term Goals - 06/22/21 0934       PT LONG TERM  GOAL #1   Title Patient will be I with final HEP to maintain progress with PT    Baseline HEP provided at eval    Time 8    Period Weeks    Status New    Target Date 08/17/21      PT LONG TERM GOAL #2   Title Patient will demonstrate 5/5 MMT right knee to improve stair negotiation    Baseline right knee extension 4/5 MMT, knee flexion 4+/5 MMT    Time 8    Period Weeks    Status New    Target Date 08/17/21      PT LONG TERM GOAL #3   Title Patient will report no increase in pain or limitations with walking to improve community access and grocery shopping    Baseline patient reports increased pain and fatigue walking around grocery store    Time 8    Period Weeks    Status New    Target Date 08/17/21      PT LONG TERM GOAL #4   Title Patient will perform proper squat technique without increase in pain in order to return to exercise and perform household tasks    Baseline patient exhibits left weight shift with reduced right knee bend, increase pain with squatting    Time 8    Period Weeks    Status New    Target Date 08/17/21                   Plan - 07/05/21 0920     Clinical Impression Statement Pt was once again able to complete prescribed exercises with no adverse effect. Today's session continued to focus on  increasing LE muscle strength in order to decrease R knee pain and improve stability/mobility. Pt continues to benefit from skilled PT services and should continue to be seen and progressed as able.    PT Treatment/Interventions ADLs/Self Care Home Management;Aquatic Therapy;Cryotherapy;Electrical Stimulation;Iontophoresis '4mg'$ /ml Dexamethasone;Moist Heat;Traction;Ultrasound;Neuromuscular re-education;Balance training;Therapeutic exercise;Therapeutic activities;Functional mobility training;Stair training;Gait training;Patient/family education;Manual techniques;Dry needling;Passive range of motion;Taping;Vasopneumatic Device;Spinal Manipulations;Joint Manipulations    PT Next Visit Plan Review HEP and progress PRN, continue OKC strengthening progressing to CKC strengthening as tolerate, balance training    PT Home Exercise Plan [facility]             Patient will benefit from skilled therapeutic intervention in order to improve the following deficits and impairments:  Abnormal gait, Decreased range of motion, Difficulty walking, Decreased strength, Decreased balance, Improper body mechanics, Impaired flexibility, Decreased activity tolerance, Pain, Increased edema  Visit Diagnosis: Acute pain of right knee  Stiffness of right knee, not elsewhere classified  Muscle weakness (generalized)     Problem List Patient Active Problem List   Diagnosis Date Noted   Nexplanon in place 09/06/2020   BMI 40.0-44.9, adult ([place]) 09/06/2020    Ward Chatters, PT 07/05/2021, 9:53 AM  [facility] [address] [place], [place], [address] Phone: [phone]   Fax:  [phone]  Name: Brianna Jordan MRN: [id number] Date of Birth: [birth year]

## 2021-07-07 ENCOUNTER — Ambulatory Visit: Payer: Medicaid Other

## 2021-07-07 ENCOUNTER — Other Ambulatory Visit: Payer: Self-pay

## 2021-07-07 DIAGNOSIS — M25561 Pain in right knee: Secondary | ICD-10-CM

## 2021-07-07 DIAGNOSIS — R2689 Other abnormalities of gait and mobility: Secondary | ICD-10-CM

## 2021-07-07 DIAGNOSIS — R600 Localized edema: Secondary | ICD-10-CM

## 2021-07-07 DIAGNOSIS — M25661 Stiffness of right knee, not elsewhere classified: Secondary | ICD-10-CM

## 2021-07-07 DIAGNOSIS — M6281 Muscle weakness (generalized): Secondary | ICD-10-CM

## 2021-07-07 NOTE — Therapy (Signed)
[place], [place], [address] Phone: [phone]   Fax:  [phone]  Physical Therapy Treatment  Patient Details  Name: Brianna Jordan MRN: [id number] Date of Birth: 07/26/1986 Referring Provider (PT): Newt Minion, MD   Encounter Date: 07/07/2021   PT End of Session - 07/07/21 0926     Visit Number 4    Number of Visits 16    Date for PT Re-Evaluation 08/17/21    Authorization Type [employer] [place]    Progress Note Due on Visit 27    PT Start Time 0930    PT Stop Time 1010    PT Time Calculation (min) 40 min    Activity Tolerance Patient tolerated treatment well    Behavior During Therapy [facility] for tasks assessed/performed             Past Medical History:  Diagnosis Date   Anxiety    reports dx from previous MD   Hypertension     Past Surgical History:  Procedure Laterality Date   APPENDECTOMY     CESAREAN SECTION     FOOT SURGERY  2021    There were no vitals filed for this visit.   Subjective Assessment - 07/07/21 0926     Subjective Pt presents to PT with reports of slight R knee pain. Pt had some muscle soreness after last session, but denied increased pain. She is ready to begin PT at this time.    Currently in Pain? Yes    Pain Score 2     Pain Orientation Right           [facility] Adult PT Treatment/Exercise:   Therapeutic Exercise:  NuStep lvl 5 x 5 min UE/LE while taking subjective Supine SLR 2x10 ea Bridge w/ blue tband clamshell 3x10 - 3 sec hold  S/L clamshell blue tband 2x10 ea Standing hip abd/ext 2x10 ea red tband Lateral walk red tband x 2 laps at counter  Interventions Not Performed Today: S/L clamshell blue tband 2x10 ea LAQ 2x10 3lbs Quad set x 10    [facility] PT Assessment - 07/07/21 0001       AROM   Right Knee Flexion 125                                      PT Short Term Goals - 07/07/21 0956       PT SHORT TERM GOAL #1    Title Patient will be I with initial HEP to progress with PT    Baseline HEP provided at evaluation    Time 4    Period Weeks    Status Achieved    Target Date 07/20/21      PT SHORT TERM GOAL #2   Title Patient will demonstrate >/= 120 deg right knee flexion to improve improve transfers without pain    Baseline 110 deg;  125 deg on 07/07/21    Time 4    Period Weeks    Status Achieved    Target Date 07/20/21      PT SHORT TERM GOAL #3   Title Patient will demonstrate ambulation without antalgic gait pattern on right to reduce pain and improve mobility    Baseline patient exhibits antalgic gait on right    Time 4    Period Weeks    Status Achieved    Target Date 07/20/21  PT Long Term Goals - 06/22/21 0934       PT LONG TERM GOAL #1   Title Patient will be I with final HEP to maintain progress with PT    Baseline HEP provided at eval    Time 8    Period Weeks    Status New    Target Date 08/17/21      PT LONG TERM GOAL #2   Title Patient will demonstrate 5/5 MMT right knee to improve stair negotiation    Baseline right knee extension 4/5 MMT, knee flexion 4+/5 MMT    Time 8    Period Weeks    Status New    Target Date 08/17/21      PT LONG TERM GOAL #3   Title Patient will report no increase in pain or limitations with walking to improve community access and grocery shopping    Baseline patient reports increased pain and fatigue walking around grocery store    Time 8    Period Weeks    Status New    Target Date 08/17/21      PT LONG TERM GOAL #4   Title Patient will perform proper squat technique without increase in pain in order to return to exercise and perform household tasks    Baseline patient exhibits left weight shift with reduced right knee bend, increase pain with squatting    Time 8    Period Weeks    Status New    Target Date 08/17/21                   Plan - 07/07/21 0943     Clinical Impression Statement Pt was able  to complete prescribed exercises with no change in baseline pain. Progressed standing proximal hip strengthening today with continued emphasis on general LE strengthening to reduce R knee pain. She continues to benefit from skilled PT services and will continue to be seen and progressed as tolerated.    PT Treatment/Interventions ADLs/Self Care Home Management;Aquatic Therapy;Cryotherapy;Electrical Stimulation;Iontophoresis '4mg'$ /ml Dexamethasone;Moist Heat;Traction;Ultrasound;Neuromuscular re-education;Balance training;Therapeutic exercise;Therapeutic activities;Functional mobility training;Stair training;Gait training;Patient/family education;Manual techniques;Dry needling;Passive range of motion;Taping;Vasopneumatic Device;Spinal Manipulations;Joint Manipulations    PT Next Visit Plan Review HEP and progress PRN, continue OKC strengthening progressing to CKC strengthening as tolerate, balance training    PT Home Exercise Plan [facility]             Patient will benefit from skilled therapeutic intervention in order to improve the following deficits and impairments:  Abnormal gait, Decreased range of motion, Difficulty walking, Decreased strength, Decreased balance, Improper body mechanics, Impaired flexibility, Decreased activity tolerance, Pain, Increased edema  Visit Diagnosis: Acute pain of right knee  Stiffness of right knee, not elsewhere classified  Muscle weakness (generalized)  Other abnormalities of gait and mobility  Localized edema     Problem List Patient Active Problem List   Diagnosis Date Noted   Nexplanon in place 09/06/2020   BMI 40.0-44.9, adult ([place]) 09/06/2020    Ward Chatters, PT 07/07/2021, 10:19 AM  [facility] [address] [place], [place], [address] Phone: [phone]   Fax:  [phone]  Name: Ashiana Starlin MRN: [id number] Date of Birth: 1986-05-28

## 2021-07-11 ENCOUNTER — Ambulatory Visit: Payer: Medicaid Other | Admitting: Orthopedic Surgery

## 2021-07-12 ENCOUNTER — Ambulatory Visit: Payer: Medicaid Other

## 2021-07-12 ENCOUNTER — Other Ambulatory Visit: Payer: Self-pay

## 2021-07-12 DIAGNOSIS — R2689 Other abnormalities of gait and mobility: Secondary | ICD-10-CM

## 2021-07-12 DIAGNOSIS — M6281 Muscle weakness (generalized): Secondary | ICD-10-CM

## 2021-07-12 DIAGNOSIS — M25661 Stiffness of right knee, not elsewhere classified: Secondary | ICD-10-CM

## 2021-07-12 DIAGNOSIS — M25561 Pain in right knee: Secondary | ICD-10-CM | POA: Diagnosis not present

## 2021-07-12 DIAGNOSIS — R600 Localized edema: Secondary | ICD-10-CM

## 2021-07-12 NOTE — Therapy (Signed)
[place], [place], [address] Phone: [phone]   Fax:  [phone]  Physical Therapy Treatment  Patient Details  Name: Brianna Jordan MRN: [id number] Date of Birth: 11-27-1985 Referring Provider (PT): Newt Minion, MD   Encounter Date: 07/12/2021   PT End of Session - 07/12/21 0956     Visit Number 5    Number of Visits 16    Date for PT Re-Evaluation 08/17/21    Authorization Type UHC [place] - Number of Visits 5    Progress Note Due on Visit 27    PT Start Time 1000    PT Stop Time 1041    PT Time Calculation (min) 41 min    Activity Tolerance Patient tolerated treatment well    Behavior During Therapy [facility] for tasks assessed/performed             Past Medical History:  Diagnosis Date   Anxiety    reports dx from previous MD   Hypertension     Past Surgical History:  Procedure Laterality Date   APPENDECTOMY     CESAREAN SECTION     FOOT SURGERY  2021    There were no vitals filed for this visit.   Subjective Assessment - 07/12/21 0957     Subjective Pt presents to PT with continued R knee pain. Pt had some increase in pain over the weekend, attributing this to increased standing [place]ty [place] the mall. She has been compliant with HEP with no adverse effect. Pt is ready to begin PT treatment at this time.    Currently in Pain? Yes    Pain Score 2     Pain Location Knee    Pain Orientation Right           [facility] Adult PT Treatment/Exercise:   Therapeutic Exercise:  NuStep lvl 5 x 5 min UE/LE while taking subjective Supine SLR 2x10 ea 2lb Bridge w/ blue tband clamshell 2x15 - 3 sec hold  Standing hip abd/ext 2x10 ea red tband Lateral walk red tband x 3 laps at counter STS 3x10 - no UE support   Interventions Not Performed Today: S/L clamshell blue tband 2x10 ea LAQ 2x10 3lbs Quad set x 10                               PT Short  Term Goals - 07/07/21 0956       PT SHORT TERM GOAL #1   Title Patient will be I with initial HEP to progress with PT    Baseline HEP provided at evaluation    Time 4    Period Weeks    Status Achieved    Target Date 07/20/21      PT SHORT TERM GOAL #2   Title Patient will demonstrate >/= 120 deg right knee flexion to improve improve transfers without pain    Baseline 110 deg;  125 deg on 07/07/21    Time 4    Period Weeks    Status Achieved    Target Date 07/20/21      PT SHORT TERM GOAL #3   Title Patient will demonstrate ambulation without antalgic gait pattern on right to reduce pain and improve mobility    Baseline patient exhibits antalgic gait on right    Time 4    Period Weeks    Status Achieved    Target Date  07/20/21               PT Long Term Goals - 06/22/21 0934       PT LONG TERM GOAL #1   Title Patient will be I with final HEP to maintain progress with PT    Baseline HEP provided at eval    Time 8    Period Weeks    Status New    Target Date 08/17/21      PT LONG TERM GOAL #2   Title Patient will demonstrate 5/5 MMT right knee to improve stair negotiation    Baseline right knee extension 4/5 MMT, knee flexion 4+/5 MMT    Time 8    Period Weeks    Status New    Target Date 08/17/21      PT LONG TERM GOAL #3   Title Patient will report no increase in pain or limitations with walking to improve community access and grocery shopping    Baseline patient reports increased pain and fatigue walking around grocery store    Time 8    Period Weeks    Status New    Target Date 08/17/21      PT LONG TERM GOAL #4   Title Patient will perform proper squat technique without increase in pain in order to return to exercise and perform household tasks    Baseline patient exhibits left weight shift with reduced right knee bend, increase pain with squatting    Time 8    Period Weeks    Status New    Target Date 08/17/21                   Plan -  07/12/21 1027     Clinical Impression Statement Pt again able to complete prescribed exercises with no adverse effect. She continues to progress well, with today's session progressing difficulty of exercises continuing to focus on increasing quad and proximal hip strength. PT will continue to progress as able per POC as prescribed.    PT Treatment/Interventions ADLs/Self Care Home Management;Aquatic Therapy;Cryotherapy;Electrical Stimulation;Iontophoresis '4mg'$ /ml Dexamethasone;Moist Heat;Traction;Ultrasound;Neuromuscular re-education;Balance training;Therapeutic exercise;Therapeutic activities;Functional mobility training;Stair training;Gait training;Patient/family education;Manual techniques;Dry needling;Passive range of motion;Taping;Vasopneumatic Device;Spinal Manipulations;Joint Manipulations    PT Next Visit Plan OKC strengthening progressing to CKC strengthening as tolerate, balance training    PT Home Exercise Plan [facility]             Patient will benefit from skilled therapeutic intervention in order to improve the following deficits and impairments:  Abnormal gait, Decreased range of motion, Difficulty walking, Decreased strength, Decreased balance, Improper body mechanics, Impaired flexibility, Decreased activity tolerance, Pain, Increased edema  Visit Diagnosis: Acute pain of right knee  Stiffness of right knee, not elsewhere classified  Muscle weakness (generalized)  Other abnormalities of gait and mobility  Localized edema     Problem List Patient Active Problem List   Diagnosis Date Noted   Nexplanon in place 09/06/2020   BMI 40.0-44.9, adult ([place]) 09/06/2020    Ward Chatters, PT 07/12/2021, 10:43 AM  [facility] [address] [place], [place], [address] Phone: [phone]   Fax:  [phone]  Name: Brianna Jordan MRN: [id number] Date of Birth: August 08, 1986

## 2021-07-13 ENCOUNTER — Ambulatory Visit: Payer: Medicaid Other

## 2021-07-13 DIAGNOSIS — M25561 Pain in right knee: Secondary | ICD-10-CM

## 2021-07-13 DIAGNOSIS — R2689 Other abnormalities of gait and mobility: Secondary | ICD-10-CM

## 2021-07-13 DIAGNOSIS — M25661 Stiffness of right knee, not elsewhere classified: Secondary | ICD-10-CM

## 2021-07-13 DIAGNOSIS — R600 Localized edema: Secondary | ICD-10-CM

## 2021-07-13 DIAGNOSIS — M6281 Muscle weakness (generalized): Secondary | ICD-10-CM

## 2021-07-13 NOTE — Therapy (Signed)
[place], [place], [address] Phone: [phone]   Fax:  [phone]  Physical Therapy Treatment  Patient Details  Name: Brianna Jordan MRN: [id number] Date of Birth: [birth year] Referring Provider (PT): Newt Minion, MD   Encounter Date: 07/13/2021   PT End of Session - 07/13/21 0909     Visit Number 6    Number of Visits 16    Date for PT Re-Evaluation 08/17/21    Authorization Type [employer]HC [place] - Number of Visits 6    Progress Note Due on Visit 27    PT Start Time 0911    PT Stop Time 0952    PT Time Calculation (min) 41 min    Activity Tolerance Patient tolerated treatment well    Behavior During Therapy [facility] for tasks assessed/performed             Past Medical History:  Diagnosis Date   Anxiety    reports dx from previous MD   Hypertension     Past Surgical History:  Procedure Laterality Date   APPENDECTOMY     CESAREAN SECTION     FOOT SURGERY  2021    There were no vitals filed for this visit.   Subjective Assessment - 07/13/21 0910     Subjective Pt presents to PT with no current reports of pain. She has some muscle soreness and had an instance of L knee buckling d/t fatigue after therapy yesterday. She is ready to begin PT treatment at this time.    Currently in Pain? Yes    Pain Score 0-No pain           [facility] Adult PT Treatment/Exercise:   Therapeutic Exercise:  NuStep lvl 5 x 5 min UE/LE while taking subjective Total gym leg press 2x10 65lb Wall squat 2x5    Past Interventions Not Performed Today: S/L clamshell blue tband 2x10 ea LAQ 2x10 3lbs Quad set x 10 Supine SLR 2x10 ea 2lb Bridge w/ blue tband clamshell 2x15 - 3 sec hold  Standing hip abd/ext 2x10 ea red tband Lateral walk red tband x 3 laps at counter STS 3x10 - no UE support  Neuro Re-Ed: Tandem walk x 3 laps in // Tandem stance 2x30 sec ea position High march in // x 3 laps FT on  foam w/ head turns 2x30 sec Lateral step ups 2 x 60 sec                               PT Short Term Goals - 07/07/21 0956       PT SHORT TERM GOAL #1   Title Patient will be I with initial HEP to progress with PT    Baseline HEP provided at evaluation    Time 4    Period Weeks    Status Achieved    Target Date 07/20/21      PT SHORT TERM GOAL #2   Title Patient will demonstrate >/= 120 deg right knee flexion to improve improve transfers without pain    Baseline 110 deg;  125 deg on 07/07/21    Time 4    Period Weeks    Status Achieved    Target Date 07/20/21      PT SHORT TERM GOAL #3   Title Patient will demonstrate ambulation without antalgic gait pattern on right to reduce pain and improve mobility  Baseline patient exhibits antalgic gait on right    Time 4    Period Weeks    Status Achieved    Target Date 07/20/21               PT Long Term Goals - 06/22/21 0934       PT LONG TERM GOAL #1   Title Patient will be I with final HEP to maintain progress with PT    Baseline HEP provided at eval    Time 8    Period Weeks    Status New    Target Date 08/17/21      PT LONG TERM GOAL #2   Title Patient will demonstrate 5/5 MMT right knee to improve stair negotiation    Baseline right knee extension 4/5 MMT, knee flexion 4+/5 MMT    Time 8    Period Weeks    Status New    Target Date 08/17/21      PT LONG TERM GOAL #3   Title Patient will report no increase in pain or limitations with walking to improve community access and grocery shopping    Baseline patient reports increased pain and fatigue walking around grocery store    Time 8    Period Weeks    Status New    Target Date 08/17/21      PT LONG TERM GOAL #4   Title Patient will perform proper squat technique without increase in pain in order to return to exercise and perform household tasks    Baseline patient exhibits left weight shift with reduced right knee bend, increase  pain with squatting    Time 8    Period Weeks    Status New    Target Date 08/17/21                   Plan - 07/13/21 [id number]     Clinical Impression Statement Pt able to complete all prescribed exercises with no adverse effect but did have slight increase in knee  pain. Today's session focused on continuation of LE strengthening and balance training, with pt showing continued improvement in strength, stability, and functional activity tolerance. She continues to benefit from skilled PT services working towards decreasing R knee pain and improving functional ability. Will continue to progress as tolerated per POC.    PT Treatment/Interventions ADLs/Self Care Home Management;Aquatic Therapy;Cryotherapy;Electrical Stimulation;Iontophoresis '4mg'$ /ml Dexamethasone;Moist Heat;Traction;Ultrasound;Neuromuscular re-education;Balance training;Therapeutic exercise;Therapeutic activities;Functional mobility training;Stair training;Gait training;Patient/family education;Manual techniques;Dry needling;Passive range of motion;Taping;Vasopneumatic Device;Spinal Manipulations;Joint Manipulations    PT Next Visit Plan OKC strengthening progressing to CKC strengthening as tolerate, balance training    PT Home Exercise Plan [facility]             Patient will benefit from skilled therapeutic intervention in order to improve the following deficits and impairments:  Abnormal gait, Decreased range of motion, Difficulty walking, Decreased strength, Decreased balance, Improper body mechanics, Impaired flexibility, Decreased activity tolerance, Pain, Increased edema  Visit Diagnosis: Acute pain of right knee  Stiffness of right knee, not elsewhere classified  Muscle weakness (generalized)  Other abnormalities of gait and mobility  Localized edema     Problem List Patient Active Problem List   Diagnosis Date Noted   Nexplanon in place 09/06/2020   BMI 40.0-44.9, adult ([place]) 09/06/2020    Ward Chatters, PT 07/13/2021, 9:54 AM  [facility] [address] [place], [place], [address] Phone: [phone]   Fax:  [phone]  Name: Brianna Frame  Jordan MRN: [id number] Date of Birth: [birth year]

## 2021-07-17 ENCOUNTER — Ambulatory Visit: Payer: Medicaid Other | Admitting: Orthopedic Surgery

## 2021-07-17 ENCOUNTER — Encounter: Payer: Self-pay | Admitting: Orthopedic Surgery

## 2021-07-17 DIAGNOSIS — M25561 Pain in right knee: Secondary | ICD-10-CM | POA: Diagnosis not present

## 2021-07-17 NOTE — Progress Notes (Signed)
   Office Visit Note   Patient: Brianna Jordan           Date of Birth: 12/18/1985           MRN: [id number] Visit Date: 07/17/2021              Requested by: No referring provider defined for this encounter. PCP: Antony Blackbird, MD (Inactive)  Chief Complaint  Patient presents with   Right Knee - Pain      HPI: Patient is a 35 year old woman who presents in follow-up for osteoarthritis of her right knee.  She states that physical therapy has helped she feels comfortable advancing to therapy at home after several more visits.  Assessment & Plan: Visit Diagnoses:  1. Acute pain of right knee     Plan: Continue with strengthening recommended Voltaren gel topically as needed.  Follow-Up Instructions: Return if symptoms worsen or fail to improve.   Ortho Exam  Patient is alert, oriented, no adenopathy, well-dressed, normal affect, normal respiratory effort. Examination patient has an antalgic gait there is no redness no cellulitis no effusion of the right knee.  Collaterals and cruciates are stable.  Imaging: No results found. No images are attached to the encounter.  Labs: No results found for: HGBA1C, ESRSEDRATE, CRP, LABURIC, REPTSTATUS, GRAMSTAIN, CULT, LABORGA   Lab Results  Component Value Date   ALBUMIN 4.4 09/06/2020   ALBUMIN 4.4 07/10/2018   ALBUMIN 4.0 06/25/2014    No results found for: MG No results found for: VD25OH  No results found for: PREALBUMIN CBC EXTENDED Latest Ref Rng & Units 09/06/2020 07/10/2018 06/25/2014  WBC 3.4 - 10.8 x10E3/uL 6.1 6.0 7.4  RBC 3.77 - 5.28 x10E6/uL 4.16 4.04 4.31  HGB 11.1 - 15.9 g/dL 11.8 11.2 12.5  HCT 34.0 - 46.6 % 36.2 35.0 37.0  PLT 150 - 450 x10E3/uL 274 288 265  NEUTROABS 1.4 - 7.0 x10E3/uL - 3.0 3.6  LYMPHSABS 0.7 - 3.1 x10E3/uL - 2.3 3.1     There is no height or weight on file to calculate BMI.  Orders:  No orders of the defined types were placed in this encounter.  No orders of the defined types were  placed in this encounter.    Procedures: No procedures performed  Clinical Data: No additional findings.  ROS:  All other systems negative, except as noted in the HPI. Review of Systems  Objective: Vital Signs: There were no vitals taken for this visit.  Specialty Comments:  No specialty comments available.  PMFS History: Patient Active Problem List   Diagnosis Date Noted   Nexplanon in place 09/06/2020   BMI 40.0-44.9, adult ([place]) 09/06/2020   Past Medical History:  Diagnosis Date   Anxiety    reports dx from previous MD   Hypertension     History reviewed. No pertinent family history.  Past Surgical History:  Procedure Laterality Date   APPENDECTOMY     CESAREAN SECTION     FOOT SURGERY  2021   Social History   Occupational History   Not on file  Tobacco Use   Smoking status: Former    Types: Cigarettes   Smokeless tobacco: Never  Vaping Use   Vaping Use: Never used  Substance and Sexual Activity   Alcohol use: Yes    Comment: social   Drug use: No   Sexual activity: Yes    Birth control/protection: Implant

## 2021-07-18 ENCOUNTER — Ambulatory Visit: Payer: Medicaid Other | Admitting: Physical Therapy

## 2021-07-18 ENCOUNTER — Encounter: Payer: Self-pay | Admitting: Physical Therapy

## 2021-07-18 ENCOUNTER — Other Ambulatory Visit: Payer: Self-pay

## 2021-07-18 DIAGNOSIS — R600 Localized edema: Secondary | ICD-10-CM

## 2021-07-18 DIAGNOSIS — M25661 Stiffness of right knee, not elsewhere classified: Secondary | ICD-10-CM

## 2021-07-18 DIAGNOSIS — M25561 Pain in right knee: Secondary | ICD-10-CM

## 2021-07-18 DIAGNOSIS — M6281 Muscle weakness (generalized): Secondary | ICD-10-CM

## 2021-07-18 DIAGNOSIS — R2689 Other abnormalities of gait and mobility: Secondary | ICD-10-CM

## 2021-07-18 NOTE — Therapy (Signed)
[place], [place], [address] Phone: [phone]   Fax:  [phone]  Physical Therapy Treatment  Patient Details  Name: Brianna Jordan MRN: [id number] Date of Birth: 1985-12-14 Referring Provider (PT): Newt Minion, MD   Encounter Date: 07/18/2021   PT End of Session - 07/18/21 0918     Visit Number 7    Number of Visits 16    Date for PT Re-Evaluation 08/17/21    Authorization Type [employer] [place] - Number of Visits 7    Progress Note Due on Visit 27    PT Start Time 0918    PT Stop Time 1000    PT Time Calculation (min) 42 min    Activity Tolerance Patient tolerated treatment well    Behavior During Therapy [facility] for tasks assessed/performed             Past Medical History:  Diagnosis Date   Anxiety    reports dx from previous MD   Hypertension     Past Surgical History:  Procedure Laterality Date   APPENDECTOMY     CESAREAN SECTION     FOOT SURGERY  2021    There were no vitals filed for this visit.   Subjective Assessment - 07/18/21 1009     Subjective Patient reports she is doing well, states her legs feels stronger and balance is better. She does note achiness especially with changes in weather.    Patient Stated Goals Want to strengthen the knee and keep it from getting worse    Currently in Pain? Yes    Pain Score 1     Pain Location Knee    Pain Orientation Right    Pain Descriptors / Indicators Aching    Pain Onset More than a month ago    Pain Frequency Intermittent    Aggravating Factors  Standing or walking extended periods, changes in weather    Pain Relieving Factors Exercises                [facility] PT Assessment - 07/18/21 0001       Assessment   Medical Diagnosis Acute pain of right knee    Referring Provider (PT) Newt Minion, MD      Balance Screen   Has the patient fallen in the past 6 months No      AROM   Right Knee Flexion 125       Strength   Right Hip Extension 4-/5    Right Hip ABduction 4-/5    Left Hip Extension 4-/5    Left Hip ABduction 4-/5    Right Knee Flexion 4+/5    Right Knee Extension 4+/5                           [facility] Adult PT Treatment/Exercise - 07/18/21 0001       Exercises   Exercises Knee/Hip            Therapeutic Exercise:  NuStep L5 x 5 min UE/LE while taking subjective Leg press 45# x 10, 55# 2 x 10(Omega) - focus on slow/controlled movement and maintaining neutral knee alignment Sit to stand x 10  - cued for controlled lowering and knee alignment Bridge with blue 2 x 10 with 3 sec hold Clamshell with blue 2 x 10 (right only) Lateral band walk with blue at knees 2 x 10 each direction  Neuro Re-Ed: Tandem walk at counter x 3 laps SLS 3 x 20 sec - occasional UE support to maintain balance         PT Education - 07/18/21 0918     Education Details HEP update    Person(s) Educated Patient    Methods Explanation;Demonstration;Verbal cues;Handout    Comprehension Verbalized understanding;Returned demonstration;Verbal cues required;Need further instruction              PT Short Term Goals - 07/07/21 0956       PT SHORT TERM GOAL #1   Title Patient will be I with initial HEP to progress with PT    Baseline HEP provided at evaluation    Time 4    Period Weeks    Status Achieved    Target Date 07/20/21      PT SHORT TERM GOAL #2   Title Patient will demonstrate >/= 120 deg right knee flexion to improve improve transfers without pain    Baseline 110 deg;  125 deg on 07/07/21    Time 4    Period Weeks    Status Achieved    Target Date 07/20/21      PT SHORT TERM GOAL #3   Title Patient will demonstrate ambulation without antalgic gait pattern on right to reduce pain and improve mobility    Baseline patient exhibits antalgic gait on right    Time 4    Period Weeks    Status Achieved    Target Date 07/20/21               PT  Long Term Goals - 07/18/21 0924       PT LONG TERM GOAL #1   Title Patient will be I with final HEP to maintain progress with PT    Baseline progressing with advanced HEP    Time 8    Period Weeks    Status On-going    Target Date 08/17/21      PT LONG TERM GOAL #2   Title Patient will demonstrate 5/5 MMT right knee to improve stair negotiation    Baseline right knee strength grossly 4+/5 MMT    Time 8    Period Weeks    Status On-going    Target Date 08/17/21      PT LONG TERM GOAL #3   Title Patient will report no increase in pain or limitations with walking to improve community access and grocery shopping    Baseline patient continues to report slight limitation with walking    Time 8    Period Weeks    Status On-going    Target Date 08/17/21      PT LONG TERM GOAL #4   Title Patient will perform proper squat technique without increase in pain in order to return to exercise and perform household tasks    Baseline patient able to perform proper squat following cues    Time 8    Period Weeks    Status On-going    Target Date 08/17/21                   Plan - 07/18/21 9798     Clinical Impression Statement Patient tolerated therapy well with no adverse effects. She demonstrates improvement in her knee strength, squatting and stair negotiation, and reports overall improved walking ability. She continues to progress well with her strengthening exercises and balance training. She did not report any increase in knee pain with therapy but did  state she felt like she was working out her muscles. Updated HEP with good tolerance. Patient would benefit from continued skilled PT to progress her motion and strength in order to improve weight bearing tasks such as walking, squats, and stairs, and maximize her functional ability.    PT Treatment/Interventions ADLs/Self Care Home Management;Aquatic Therapy;Cryotherapy;Electrical Stimulation;Iontophoresis 4mg /ml Dexamethasone;Moist  Heat;Traction;Ultrasound;Neuromuscular re-education;Balance training;Therapeutic exercise;Therapeutic activities;Functional mobility training;Stair training;Gait training;Patient/family education;Manual techniques;Dry needling;Passive range of motion;Taping;Vasopneumatic Device;Spinal Manipulations;Joint Manipulations    PT Next Visit Plan Review HEP and progress PRN, continue progression of strengthening and balance training    PT Home Exercise Plan [place] and Agree with Plan of Care Patient             Patient will benefit from skilled therapeutic intervention in order to improve the following deficits and impairments:  Abnormal gait, Decreased range of motion, Difficulty walking, Decreased strength, Decreased balance, Improper body mechanics, Impaired flexibility, Decreased activity tolerance, Pain, Increased edema  Visit Diagnosis: Acute pain of right knee  Stiffness of right knee, not elsewhere classified  Muscle weakness (generalized)  Other abnormalities of gait and mobility  Localized edema     Problem List Patient Active Problem List   Diagnosis Date Noted   Nexplanon in place 09/06/2020   BMI 40.0-44.9, adult ([place]) 09/06/2020    Hilda Blades, PT, DPT, LAT, ATC 07/18/21  10:12 AM Phone: [phone] Fax: [facility] [address] [place], [place], [address] Phone: [phone]   Fax:  [phone]  Name: Brianna Jordan MRN: [id number] Date of Birth: [birth year]

## 2021-07-20 ENCOUNTER — Encounter: Payer: Self-pay | Admitting: Physical Therapy

## 2021-07-20 ENCOUNTER — Other Ambulatory Visit: Payer: Self-pay

## 2021-07-20 ENCOUNTER — Ambulatory Visit: Payer: Medicaid Other | Admitting: Physical Therapy

## 2021-07-20 DIAGNOSIS — M25561 Pain in right knee: Secondary | ICD-10-CM

## 2021-07-20 DIAGNOSIS — R2689 Other abnormalities of gait and mobility: Secondary | ICD-10-CM

## 2021-07-20 DIAGNOSIS — M6281 Muscle weakness (generalized): Secondary | ICD-10-CM

## 2021-07-20 DIAGNOSIS — R600 Localized edema: Secondary | ICD-10-CM

## 2021-07-20 DIAGNOSIS — M25661 Stiffness of right knee, not elsewhere classified: Secondary | ICD-10-CM

## 2021-07-20 NOTE — Therapy (Signed)
[place], [place], [address] Phone: [phone]   Fax:  [phone]  Physical Therapy Treatment / Discharge  Patient Details  Name: Demarie Uhlig MRN: [id number] Date of Birth: Oct 11, 1986 Referring Provider (PT): Newt Minion, MD   Encounter Date: 07/20/2021   PT End of Session - 07/20/21 0838     Visit Number 8    Number of Visits 16    Date for PT Re-Evaluation 08/17/21    Authorization Type [employer] [place] - Number of Visits 8    Progress Note Due on Visit 27    PT Start Time 0832    PT Stop Time 0913    PT Time Calculation (min) 41 min    Activity Tolerance Patient tolerated treatment well    Behavior During Therapy [facility] for tasks assessed/performed             Past Medical History:  Diagnosis Date   Anxiety    reports dx from previous MD   Hypertension     Past Surgical History:  Procedure Laterality Date   APPENDECTOMY     CESAREAN SECTION     FOOT SURGERY  2021    There were no vitals filed for this visit.   Subjective Assessment - 07/20/21 0833     Subjective Patient reports yesterday she did her exercises and then tried to go on a walk and she felt like she had to drag her leg. She denies any pain but states it feels a little tired.    Patient Stated Goals Want to strengthen the knee and keep it from getting worse    Currently in Pain? No/denies    Pain Location Knee    Pain Orientation Right                [facility] PT Assessment - 07/20/21 0001       Assessment   Medical Diagnosis Acute pain of right knee    Referring Provider (PT) Newt Minion, MD      Balance Screen   Has the patient fallen in the past 6 months No      Prior Function   Level of Independence Independent      Observation/Other Assessments   Focus on Therapeutic Outcomes (FOTO)  NA - MCD      Squat   Comments Patient demonstrates proper squat form      AROM   Right Knee  Flexion 125      Strength   Right Knee Flexion 5/5    Right Knee Extension 5/5                           [facility] Adult PT Treatment/Exercise - 07/20/21 0001       Exercises   Exercises Knee/Hip            [facility] Adult PT Treatment/Exercise:  Therapeutic Exercise: NuStep L6 x 5 min LE while taking subjective Leg press (Omega) 55# 2 x 10, 65# 2 x 10 - focus on slow/controlled movement Knee extension machine 15# 3 x 10 Sit to stand x 10 Forward step-up 8" 2 x 10  Manual Therapy: - NA  Neuromuscular re-ed: SLS 2 x 20 sec SLS on Airex 2 x 20 sec - occasional UE support to maintain balance  Therapeutic Activity: - NA         PT Education - 07/20/21 4628  Education Details POC discharge, HEP    Person(s) Educated Patient    Methods Explanation;Demonstration;Verbal cues    Comprehension Verbalized understanding;Returned demonstration;Verbal cues required;Need further instruction              PT Short Term Goals - 07/07/21 0956       PT SHORT TERM GOAL #1   Title Patient will be I with initial HEP to progress with PT    Baseline HEP provided at evaluation    Time 4    Period Weeks    Status Achieved    Target Date 07/20/21      PT SHORT TERM GOAL #2   Title Patient will demonstrate >/= 120 deg right knee flexion to improve improve transfers without pain    Baseline 110 deg;  125 deg on 07/07/21    Time 4    Period Weeks    Status Achieved    Target Date 07/20/21      PT SHORT TERM GOAL #3   Title Patient will demonstrate ambulation without antalgic gait pattern on right to reduce pain and improve mobility    Baseline patient exhibits antalgic gait on right    Time 4    Period Weeks    Status Achieved    Target Date 07/20/21               PT Long Term Goals - 07/20/21 0844       PT LONG TERM GOAL #1   Title Patient will be I with final HEP to maintain progress with PT    Baseline independent with HEP    Time 8     Period Weeks    Status Achieved      PT LONG TERM GOAL #2   Title Patient will demonstrate 5/5 MMT right knee to improve stair negotiation    Baseline right knee strength 5/5 MMT    Time 8    Period Weeks    Status Achieved      PT LONG TERM GOAL #3   Title Patient will report no increase in pain or limitations with walking to improve community access and grocery shopping    Baseline patient reports occasional difficulty with walking if she has been doing a lot    Time 8    Period Weeks    Status Partially Met      PT LONG TERM GOAL #4   Title Patient will perform proper squat technique without increase in pain in order to return to exercise and perform household tasks    Baseline patient demonstrates proper squat technique    Time 8    Period Weeks    Status Achieved                   Plan - 07/20/21 0839     Clinical Impression Statement Patient tolerated therapy well with no adverse effects. She has progressed well with therapy and demonstrates normal knee motion and strength, but does not continued occasional difficulty with walking due to fatigue. Patient encouraged to continue with HEP and walking to progress her strength and endurance. She will be discharged from PT.    PT Treatment/Interventions ADLs/Self Care Home Management;Aquatic Therapy;Cryotherapy;Electrical Stimulation;Iontophoresis 48m/ml Dexamethasone;Moist Heat;Traction;Ultrasound;Neuromuscular re-education;Balance training;Therapeutic exercise;Therapeutic activities;Functional mobility training;Stair training;Gait training;Patient/family education;Manual techniques;Dry needling;Passive range of motion;Taping;Vasopneumatic Device;Spinal Manipulations;Joint Manipulations    PT Next Visit Plan NA - discharge    PT Home Exercise Plan [place] Agree with Plan of  Care Patient             Patient will benefit from skilled therapeutic intervention in order to improve the following deficits  and impairments:  Abnormal gait, Decreased range of motion, Difficulty walking, Decreased strength, Decreased balance, Improper body mechanics, Impaired flexibility, Decreased activity tolerance, Pain, Increased edema  Visit Diagnosis: Acute pain of right knee  Stiffness of right knee, not elsewhere classified  Muscle weakness (generalized)  Other abnormalities of gait and mobility  Localized edema     Problem List Patient Active Problem List   Diagnosis Date Noted   Nexplanon in place 09/06/2020   BMI 40.0-44.9, adult ([place]) 09/06/2020    Hilda Blades, PT, DPT, LAT, ATC 07/20/21  9:18 AM Phone: [phone] Fax: [facility] [place], [place], [address] Phone: [phone]   Fax:  [phone]  Name: Marlet Korte MRN: [id number] Date of Birth: May 28, 1986   PHYSICAL THERAPY DISCHARGE SUMMARY  Visits from Start of Care: 8  Current functional level related to goals / functional outcomes: See above   Remaining deficits: See above   Education / Equipment: HEP   Patient agrees to discharge. Patient goals were met. Patient is being discharged due to being pleased with the current functional level.

## 2021-08-22 ENCOUNTER — Other Ambulatory Visit: Payer: Self-pay

## 2021-08-22 ENCOUNTER — Encounter (HOSPITAL_COMMUNITY): Payer: Self-pay | Admitting: Emergency Medicine

## 2021-08-22 ENCOUNTER — Ambulatory Visit (HOSPITAL_COMMUNITY)
Admission: EM | Admit: 2021-08-22 | Discharge: 2021-08-22 | Disposition: A | Discharge status: Home / Self Care | Payer: Medicaid Other | Attending: Emergency Medicine | Admitting: Emergency Medicine

## 2021-08-22 DIAGNOSIS — K649 Unspecified hemorrhoids: Secondary | ICD-10-CM | POA: Diagnosis not present

## 2021-08-22 DIAGNOSIS — K5900 Constipation, unspecified: Secondary | ICD-10-CM | POA: Diagnosis not present

## 2021-08-22 MED ORDER — HYDROCORTISONE ACETATE 25 MG RE SUPP: 25.0000 mg | Freq: Two times a day (BID) | RECTAL | 0 refills | Status: DC

## 2021-08-22 MED ORDER — DOCUSATE SODIUM 250 MG PO CAPS: 250.0000 mg | ORAL_CAPSULE | Freq: Every day | ORAL | 0 refills | Status: DC

## 2021-08-22 NOTE — ED Triage Notes (Signed)
Pt is present today with hemorrhoids and constipation Pt states that she noticed the hemorrhoids one week ago. Pt denies any bleeding.

## 2021-08-22 NOTE — Discharge Instructions (Signed)
Take MiraLAX daily until you have a good bowel movement. Take the Colace daily to help make your bowel movements less painful.  Make sure you are drinking plenty of fluids and try to increase the amount of fiber in your diet.  You may use the Anusol suppository twice a day as needed for hemorrhoid pain. You can continue to use Preparation H, Tucks, or witch hazel wipes as needed for comfort.  Please go to the emergency room for further evaluation if your symptoms do not improve. Follow-up with GI or general surgery for hemorrhoid removal if they continue to be a problem.

## 2021-08-22 NOTE — ED Provider Notes (Signed)
[place]    CSN: [id number] Arrival date & time: 08/22/21  1112      History   Chief Complaint Chief Complaint  Patient presents with   Hemorrhoids    HPI Brianna Jordan is a 35 y.o. female.   Patient here for evaluation of constipation and hemorrhoids.  Reports first noticing hemorrhoids approximately 1 week ago.  Reports using OTC creams and wipes which have helped with hemorrhoid pain.  Now having bloating and difficulty having a bowel movement.  Reports painful to have a bowel movement for the last few days.  Denies any bleeding with bowel movements.  Denies any trauma, injury, or other precipitating event.  Denies any fevers, chest pain, shortness of breath, N/V/D, numbness, tingling, weakness, abdominal pain, or headaches.    The history is provided by the patient.   Past Medical History:  Diagnosis Date   Anxiety    reports dx from previous MD   Hypertension     Patient Active Problem List   Diagnosis Date Noted   Nexplanon in place 09/06/2020   BMI 40.0-44.9, adult ([place]) 09/06/2020    Past Surgical History:  Procedure Laterality Date   APPENDECTOMY     CESAREAN SECTION     FOOT SURGERY  2021    OB History     Gravida  2   Para  1   Term  1   Preterm      AB      Living  2      SAB      IAB      Ectopic      Multiple      Live Births  2            Home Medications    Prior to Admission medications   Medication Sig Start Date End Date Taking? Authorizing Provider  docusate sodium (COLACE) 250 MG capsule Take 1 capsule (250 mg total) by mouth daily. 08/22/21  Yes Pearson Forster, NP  hydrocortisone (ANUSOL-HC) 25 MG suppository Place 1 suppository (25 mg total) rectally 2 (two) times daily. 08/22/21  Yes Pearson Forster, NP  acetaminophen (TYLENOL) 325 MG tablet Take 650 mg by mouth every 6 (six) hours as needed.    [provider]  ibuprofen (ADVIL) 800 MG tablet Take 1 tablet (800 mg total) by mouth 3  (three) times daily as needed for mild pain or moderate pain. 04/24/21   Teodora Medici, FNP    Family History History reviewed. No pertinent family history.  Social History Social History   Tobacco Use   Smoking status: Former    Types: Cigarettes   Smokeless tobacco: Never  Vaping Use   Vaping Use: Never used  Substance Use Topics   Alcohol use: Yes    Comment: social   Drug use: No     Allergies   Patient has no known allergies.   Review of Systems Review of Systems  Gastrointestinal:  Positive for constipation and rectal pain.  All other systems reviewed and are negative.   Physical Exam Triage Vital Signs ED Triage Vitals  Enc Vitals Group     BP 08/22/21 1228 130/86     Pulse Rate 08/22/21 1228 71     Resp 08/22/21 1228 18     Temp --      Temp src --      SpO2 08/22/21 1228 97 %     Weight --      Height --  Head Circumference --      Peak Flow --      Pain Score 08/22/21 1229 0     Pain Loc --      Pain Edu? --      Excl. in [address]? --    No data found.  Updated Vital Signs BP 130/86   Pulse 71   Resp 18   SpO2 97%   Visual Acuity Right Eye Distance:   Left Eye Distance:   Bilateral Distance:    Right Eye Near:   Left Eye Near:    Bilateral Near:     Physical Exam Vitals and nursing note reviewed.  Constitutional:      General: She is not in acute distress.    Appearance: Normal appearance. She is not ill-appearing, toxic-appearing or diaphoretic.  HENT:     Head: Normocephalic and atraumatic.  Eyes:     Conjunctiva/sclera: Conjunctivae normal.  Cardiovascular:     Rate and Rhythm: Normal rate.     Pulses: Normal pulses.  Pulmonary:     Effort: Pulmonary effort is normal.  Abdominal:     General: Abdomen is flat.     Palpations: Abdomen is soft.     Tenderness: There is no abdominal tenderness. There is no right CVA tenderness, left CVA tenderness, guarding or rebound.     Hernia: No hernia is present.  Musculoskeletal:         General: Normal range of motion.     Cervical back: Normal range of motion.  Skin:    General: Skin is warm and dry.  Neurological:     General: No focal deficit present.     Mental Status: She is alert and oriented to person, place, and time.  Psychiatric:        Mood and Affect: Mood normal.     UC Treatments / Results  Labs (all labs ordered are listed, but only abnormal results are displayed) Labs Reviewed - No data to display  EKG   Radiology No results found.  Procedures Procedures (including critical care time)  Medications Ordered in UC Medications - No data to display  Initial Impression / Assessment and Plan / UC Course  I have reviewed the triage vital signs and the nursing notes.  Pertinent labs & imaging results that were available during my care of the patient were reviewed by me and considered in my medical decision making (see chart for details).    Assessment negative for red flags or concerns.  Likely constipation and hemorrhoids.  Recommend MiraLAX daily until a good complete bowel movement.  Colace daily to help prevent painful bowel movements.  Encourage fluids and increase fiber in diet.  May use Anusol twice a day to help with pain and swelling.  Continue to use OTC medication such as Preparation H or Tucks.  Follow-up for any worsening symptoms.  Follow-up with GI or general surgery for removal if needed. Final Clinical Impressions(s) / UC Diagnoses   Final diagnoses:  Constipation, unspecified constipation type  Hemorrhoids, unspecified hemorrhoid type     Discharge Instructions      Take MiraLAX daily until you have a good bowel movement. Take the Colace daily to help make your bowel movements less painful.  Make sure you are drinking plenty of fluids and try to increase the amount of fiber in your diet.  You may use the Anusol suppository twice a day as needed for hemorrhoid pain. You can continue to use Preparation H, Tucks, or  witch hazel wipes as needed for comfort.  Please go to the emergency room for further evaluation if your symptoms do not improve. Follow-up with GI or general surgery for hemorrhoid removal if they continue to be a problem.     ED Prescriptions     Medication Sig Dispense Auth. Provider   hydrocortisone (ANUSOL-HC) 25 MG suppository Place 1 suppository (25 mg total) rectally 2 (two) times daily. 12 suppository Caroll Rancher R, NP   docusate sodium (COLACE) 250 MG capsule Take 1 capsule (250 mg total) by mouth daily. 10 capsule Pearson Forster, NP      PDMP not reviewed this encounter.   Pearson Forster, NP 08/22/21 1304

## 2022-08-05 DIAGNOSIS — J111 Influenza due to unidentified influenza virus with other respiratory manifestations: Secondary | ICD-10-CM | POA: Diagnosis not present

## 2022-08-19 DIAGNOSIS — J111 Influenza due to unidentified influenza virus with other respiratory manifestations: Secondary | ICD-10-CM | POA: Diagnosis not present

## 2022-09-26 DIAGNOSIS — U071 COVID-19: Secondary | ICD-10-CM | POA: Diagnosis not present

## 2022-10-24 DIAGNOSIS — J111 Influenza due to unidentified influenza virus with other respiratory manifestations: Secondary | ICD-10-CM | POA: Diagnosis not present

## 2022-10-24 IMAGING — MR MR KNEE*R* W/O CM
6 series · 39 of 40 positions shown · non-contrast
Comparison: X-ray 04/27/2021

CLINICAL DATA: Acute right knee pain

EXAM:
MRI OF THE RIGHT KNEE WITHOUT CONTRAST
TECHNIQUE: Multiplanar, multisequence MR imaging of the knee was performed. No
intravenous contrast was administered.

[Series 5: T2 fat-sat · axial · right · 4.0mm · 0.50mm/px · z∈[-85,+55]mm · 8 of 33 slices shown (1 of 3)]
[im 1/33]
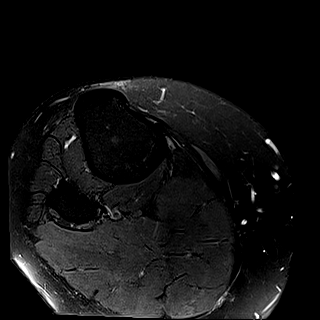
[im 5/33]
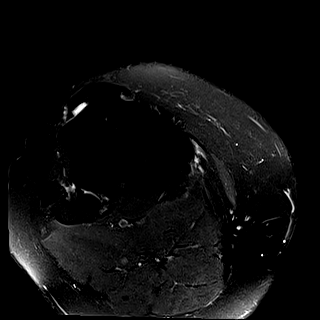
[im 10/33]
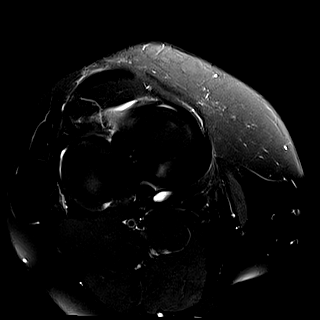
[im 14/33]
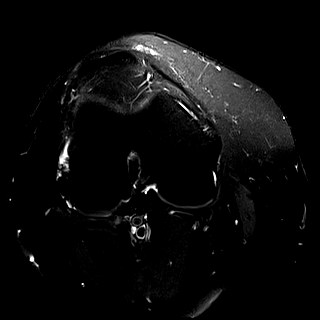
[im 19/33]
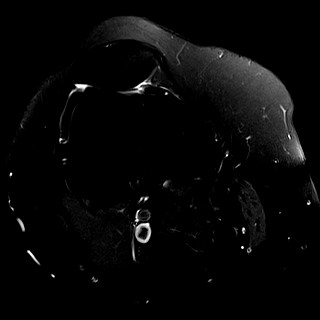
[im 23/33]
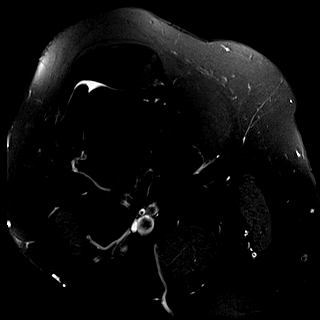
[im 28/33]
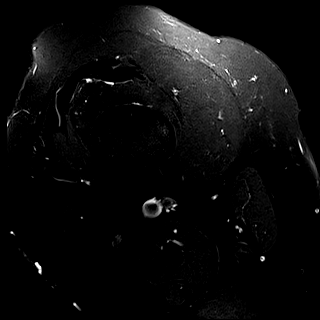
[im 33/33]
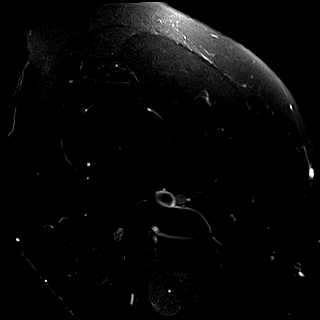

[Series 6: T1 · coronal · right · 4.0mm · 0.29mm/px · 6 of 32 slices shown]
[im 1/32]
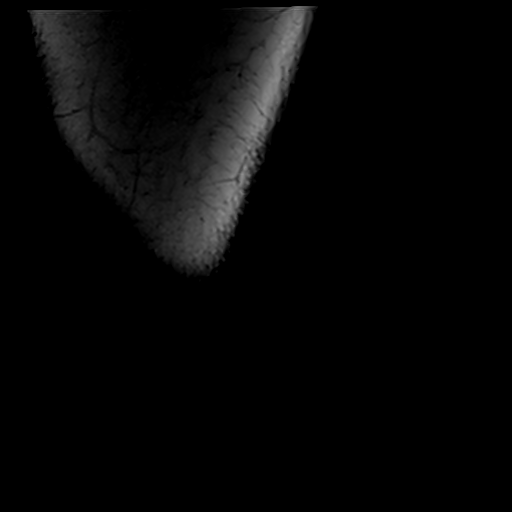
[im 6/32]
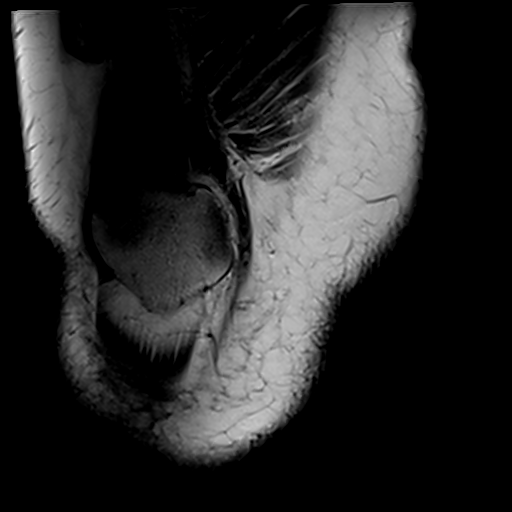
[im 11/32]
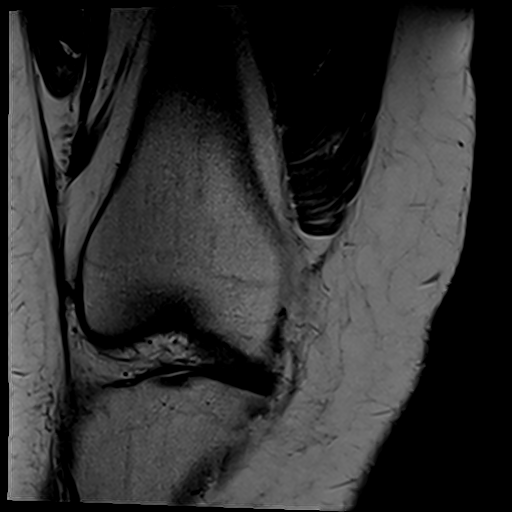
[im 16/32]
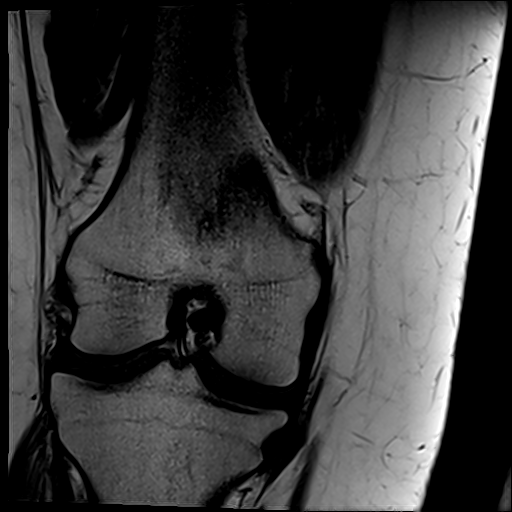
[im 21/32]
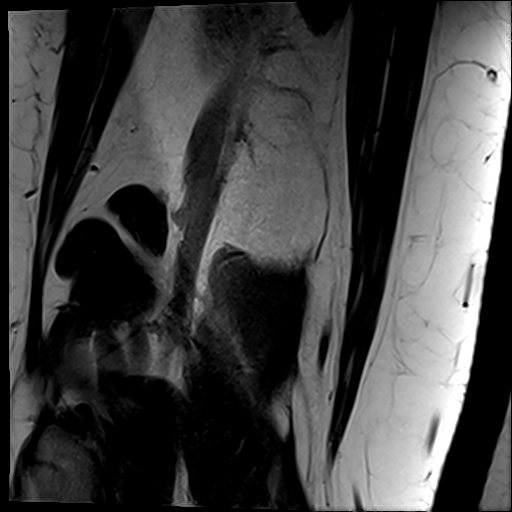
[im 26/32]
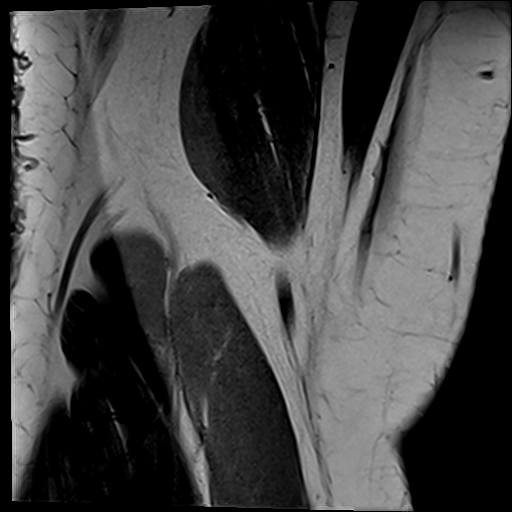

[Series 7: T2 fat-sat · coronal · right · 4.0mm · 0.59mm/px · 7 of 32 slices shown (2 of 3)]
[im 1/32]
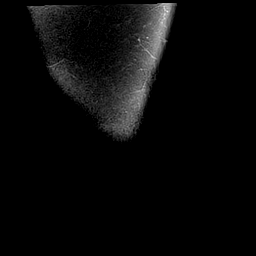
[im 6/32]
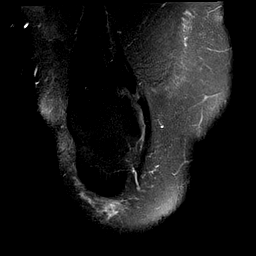
[im 11/32]
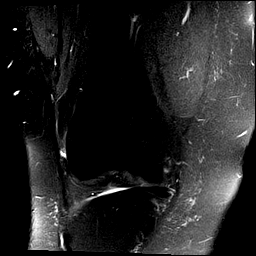
[im 16/32]
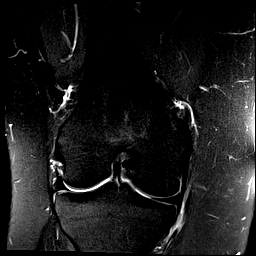
[im 21/32]
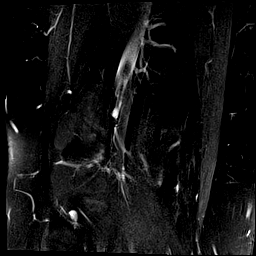
[im 26/32]
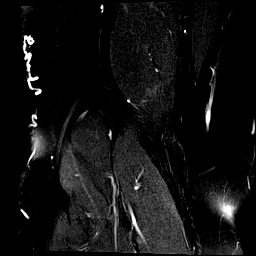
[im 32/32]
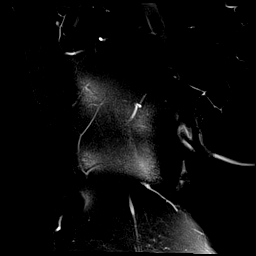

[Series 8: PD fat-sat · coronal · right · 3.0mm · 0.47mm/px · 8 of 36 slices shown (1 of 2)]
[im 1/36]
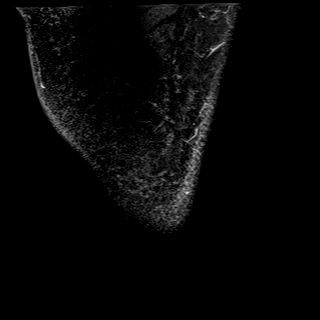
[im 6/36]
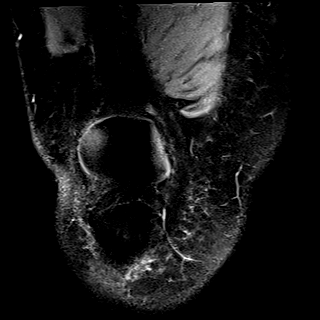
[im 11/36]
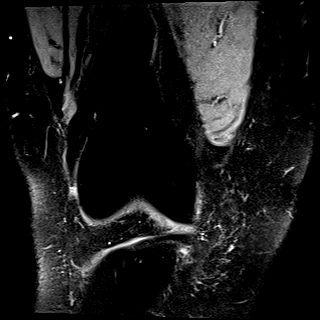
[im 16/36]
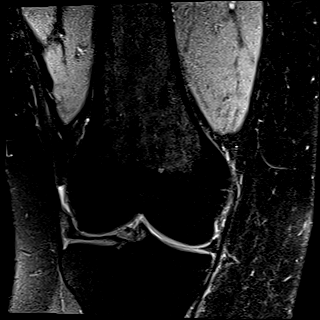
[im 21/36]
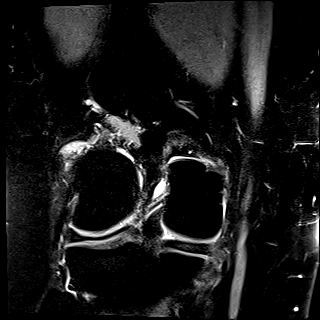
[im 26/36]
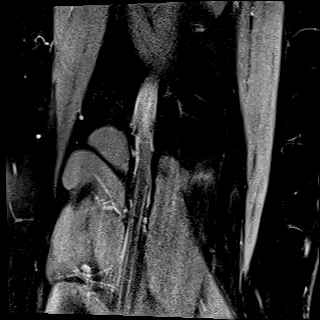
[im 31/36]
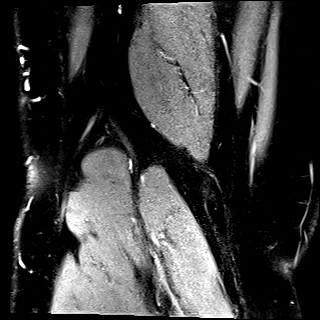
[im 36/36]
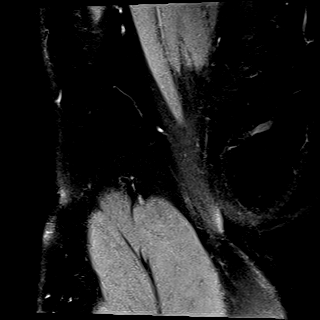

[Series 9: PD fat-sat · sagittal · right · 4.0mm · 0.47mm/px · 5 of 22 slices shown (2 of 2)]
[im 1/22]
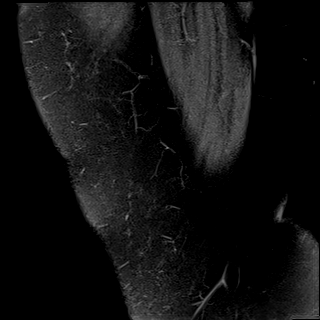
[im 6/22]
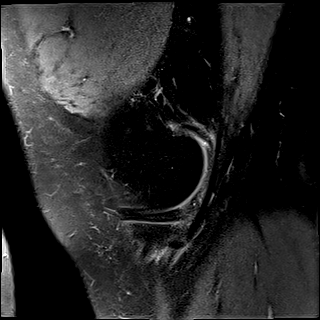
[im 11/22]
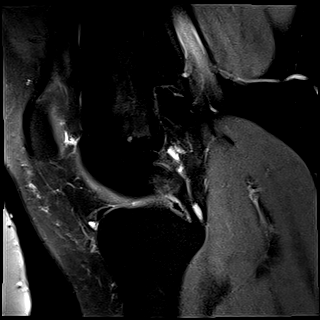
[im 16/22]
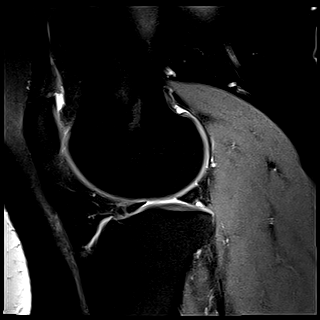
[im 22/22]
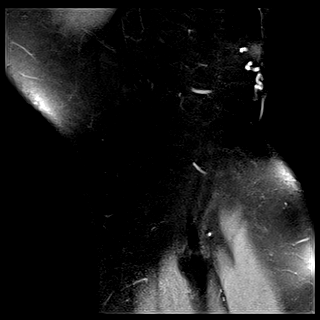

[Series 10: T2 fat-sat · sagittal · right · 4.0mm · 0.47mm/px · 5 of 22 slices shown (3 of 3)]
[im 1/22]
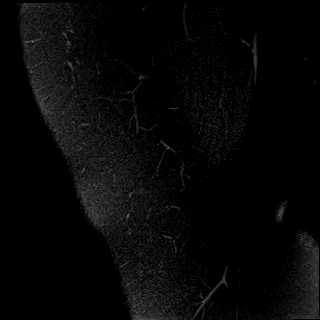
[im 6/22]
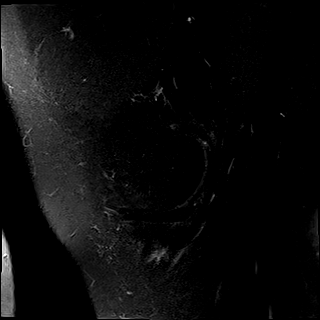
[im 11/22]
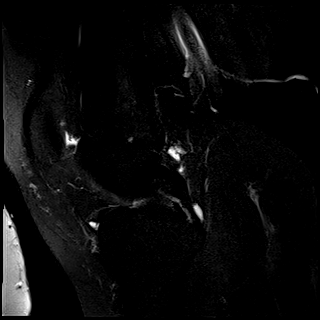
[im 16/22]
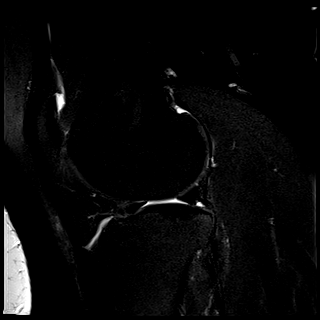
[im 22/22]
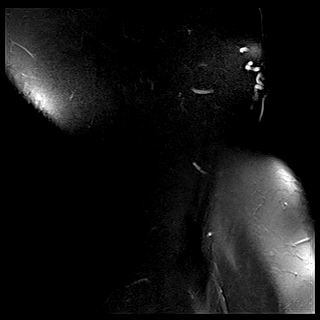

[39 of 40 positions shown; findings below may reference images not displayed]

FINDINGS: MENISCI

Medial meniscus: Intrasubstance degeneration with subtle
undersurface fraying of the posterior horn. No well-defined meniscal
tear.

Lateral meniscus: Mild intrasubstance degeneration without focal
tear.

LIGAMENTS

Cruciates:  Intact ACL and PCL.

Collaterals: Medial collateral ligament is intact. Lateral
collateral ligament complex is intact.

CARTILAGE

Patellofemoral:  No chondral defect.

Medial:  No chondral defect.

Lateral:  No chondral defect.

Joint:  No joint effusion.  Normal fat pads.

Popliteal Fossa:  No Baker cyst. Intact popliteus tendon.

Extensor Mechanism:  Intact quadriceps tendon and patellar tendon.

Bones: No focal marrow signal abnormality. No fracture or
dislocation.

Other: Mild pes anserine bursal fluid. Normal muscle bulk and signal
intensity.
IMPRESSION: 1. Intrasubstance degeneration with subtle undersurface fraying of
the posterior horn of the medial meniscus. No well-defined meniscal
tear.
2. Mild pes anserine bursitis.

## 2022-12-24 ENCOUNTER — Ambulatory Visit (HOSPITAL_COMMUNITY)
Admission: EM | Admit: 2022-12-24 | Discharge: 2022-12-24 | Disposition: A | Discharge status: Home / Self Care | Payer: Medicaid Other | Attending: Physician Assistant | Admitting: Physician Assistant

## 2022-12-24 ENCOUNTER — Encounter (HOSPITAL_COMMUNITY): Payer: Self-pay

## 2022-12-24 DIAGNOSIS — M5441 Lumbago with sciatica, right side: Secondary | ICD-10-CM | POA: Diagnosis not present

## 2022-12-24 MED ORDER — CYCLOBENZAPRINE HCL 10 MG PO TABS: 10.0000 mg | ORAL_TABLET | Freq: Three times a day (TID) | ORAL | 0 refills | Status: DC | PRN

## 2022-12-24 MED ORDER — PREDNISONE 20 MG PO TABS: 40.0000 mg | ORAL_TABLET | Freq: Every day | ORAL | 0 refills | Status: AC

## 2022-12-24 NOTE — ED Provider Notes (Signed)
[facility]    CSN: [id number] Arrival date & time: 12/24/22  1548      History   Chief Complaint Chief Complaint  Patient presents with   Back Pain    HPI Brianna Jordan is a 37 y.o. female.   Complains of lower back pain that started several days ago.  Denies injury or trauma.  She does report moving furniture during a recent move.  She has history of back pain.  She complains of radiation of pain to posterior right leg to the knee.  Denies numbness or tingling.  She does report that the leg felt like it was going to give out.  She has tried Tylenol with minimal improvement.  She reports standing for long periods of time and movement makes pain worse.  Denies saddle anesthesia, loss of bowel or bladder control.  Reports an MRI in the past unsure of when showed a bulging disc.  Had no Ortho/spine follow-up    Past Medical History:  Diagnosis Date   Anxiety    reports dx from previous MD   Hypertension     Patient Active Problem List   Diagnosis Date Noted   Nexplanon in place 09/06/2020   BMI 40.0-44.9, adult ([place]) 09/06/2020    Past Surgical History:  Procedure Laterality Date   APPENDECTOMY     CESAREAN SECTION     FOOT SURGERY  2021    OB History     Gravida  2   Para  1   Term  1   Preterm      AB      Living  2      SAB      IAB      Ectopic      Multiple      Live Births  2            Home Medications    Prior to Admission medications   Medication Sig Start Date End Date Taking? Authorizing Provider  acetaminophen (TYLENOL) 325 MG tablet Take 650 mg by mouth every 6 (six) hours as needed.   Yes [provider]  cyclobenzaprine (FLEXERIL) 10 MG tablet Take 1 tablet (10 mg total) by mouth 3 (three) times daily as needed for muscle spasms. 12/24/22  Yes Ward, Lenise Arena, PA-C  docusate sodium (COLACE) 250 MG capsule Take 1 capsule (250 mg total) by mouth daily. 08/22/21  Yes Pearson Forster, NP  hydrocortisone  (ANUSOL-HC) 25 MG suppository Place 1 suppository (25 mg total) rectally 2 (two) times daily. 08/22/21  Yes Pearson Forster, NP  ibuprofen (ADVIL) 800 MG tablet Take 1 tablet (800 mg total) by mouth 3 (three) times daily as needed for mild pain or moderate pain. 04/24/21  Yes Mound, Hildred Alamin E, FNP  predniSONE (DELTASONE) 20 MG tablet Take 2 tablets (40 mg total) by mouth daily for 5 days. 12/24/22 12/29/22 Yes Ward, Lenise Arena, PA-C    Family History History reviewed. No pertinent family history.  Social History Social History   Tobacco Use   Smoking status: Former    Types: Cigarettes   Smokeless tobacco: Never  Vaping Use   Vaping Use: Never used  Substance Use Topics   Alcohol use: Yes    Comment: social   Drug use: No     Allergies   Patient has no known allergies.   Review of Systems Review of Systems  Constitutional:  Negative for chills and fever.  HENT:  Negative for ear  pain and sore throat.   Eyes:  Negative for pain and visual disturbance.  Respiratory:  Negative for cough and shortness of breath.   Cardiovascular:  Negative for chest pain and palpitations.  Gastrointestinal:  Negative for abdominal pain and vomiting.  Genitourinary:  Negative for dysuria and hematuria.  Musculoskeletal:  Positive for back pain. Negative for arthralgias.  Skin:  Negative for color change and rash.  Neurological:  Negative for seizures and syncope.  All other systems reviewed and are negative.    Physical Exam Triage Vital Signs ED Triage Vitals  Enc Vitals Group     BP 12/24/22 1804 114/78     Pulse Rate 12/24/22 1804 80     Resp 12/24/22 1804 12     Temp 12/24/22 1804 98.7 F (37.1 C)     Temp Source 12/24/22 1804 Oral     SpO2 12/24/22 1804 95 %     Weight --      Height --      Head Circumference --      Peak Flow --      Pain Score 12/24/22 1802 9     Pain Loc --      Pain Edu? --      Excl. in [place]? --    No data found.  Updated Vital Signs BP 114/78 (BP  Location: Left Arm)   Pulse 80   Temp 98.7 F (37.1 C) (Oral)   Resp 12   SpO2 95%   Visual Acuity Right Eye Distance:   Left Eye Distance:   Bilateral Distance:    Right Eye Near:   Left Eye Near:    Bilateral Near:     Physical Exam Vitals and nursing note reviewed.  Constitutional:      General: She is not in acute distress.    Appearance: She is well-developed.  HENT:     Head: Normocephalic and atraumatic.  Eyes:     Conjunctiva/sclera: Conjunctivae normal.  Cardiovascular:     Rate and Rhythm: Normal rate and regular rhythm.     Heart sounds: No murmur heard. Pulmonary:     Effort: Pulmonary effort is normal. No respiratory distress.     Breath sounds: Normal breath sounds.  Abdominal:     Palpations: Abdomen is soft.     Tenderness: There is no abdominal tenderness.  Musculoskeletal:        General: No swelling.     Cervical back: Neck supple.     Comments: Lumbar paraspinal musculature tenderness to palpation.  Positive right straight leg raise.  Normal strength.  Normal range of motion.  Neurovascularly intact  Skin:    General: Skin is warm and dry.     Capillary Refill: Capillary refill takes less than 2 seconds.  Neurological:     Mental Status: She is alert.  Psychiatric:        Mood and Affect: Mood normal.      UC Treatments / Results  Labs (all labs ordered are listed, but only abnormal results are displayed) Labs Reviewed - No data to display  EKG   Radiology No results found.  Procedures Procedures (including critical care time)  Medications Ordered in UC Medications - No data to display  Initial Impression / Assessment and Plan / UC Course  I have reviewed the triage vital signs and the nursing notes.  Pertinent labs & imaging results that were available during my care of the patient were reviewed by me and considered in my  medical decision making (see chart for details).     Lower back pain with radiation to right leg.  Will  treat disown and Flexeril.  supportive care discussed.  Given.  Advised follow-up with Ortho if no improvement. Final Clinical Impressions(s) / UC Diagnoses   Final diagnoses:  Acute bilateral low back pain with right-sided sciatica     Discharge Instructions      Take Flexeril as needed for muscle spasm. Take prednisone once daily.While taking prednisone avoid ibuprofen or other NSAIDs. Recommend Tylenol as needed. Recommend ice to affected area, gentle stretching, rest. If no improvement follow-up with orthopedics.   ED Prescriptions     Medication Sig Dispense Auth. Provider   cyclobenzaprine (FLEXERIL) 10 MG tablet Take 1 tablet (10 mg total) by mouth 3 (three) times daily as needed for muscle spasms. 20 tablet Ward, Lenise Arena, PA-C   predniSONE (DELTASONE) 20 MG tablet Take 2 tablets (40 mg total) by mouth daily for 5 days. 10 tablet Ward, Lenise Arena, PA-C      PDMP not reviewed this encounter.   Ward, Lenise Arena, PA-C 12/24/22 [phone]

## 2022-12-24 NOTE — Discharge Instructions (Signed)
Take Flexeril as needed for muscle spasm. Take prednisone once daily.While taking prednisone avoid ibuprofen or other NSAIDs. Recommend Tylenol as needed. Recommend ice to affected area, gentle stretching, rest. If no improvement follow-up with orthopedics.

## 2022-12-24 NOTE — ED Triage Notes (Signed)
Pt is here for back pain x 3days

## 2023-02-05 DIAGNOSIS — M545 Low back pain, unspecified: Secondary | ICD-10-CM | POA: Diagnosis not present

## 2023-07-18 ENCOUNTER — Ambulatory Visit: Payer: 59 | Attending: Internal Medicine | Admitting: Family Medicine

## 2023-07-18 ENCOUNTER — Ambulatory Visit: Payer: Medicaid Other | Admitting: Internal Medicine

## 2023-07-18 ENCOUNTER — Encounter: Payer: Self-pay | Admitting: Family Medicine

## 2023-07-18 VITALS — BP 111/72 | HR 84 | Ht 66.0 in | Wt 247.8 lb

## 2023-07-18 DIAGNOSIS — M2550 Pain in unspecified joint: Secondary | ICD-10-CM | POA: Diagnosis not present

## 2023-07-18 DIAGNOSIS — R0600 Dyspnea, unspecified: Secondary | ICD-10-CM | POA: Diagnosis not present

## 2023-07-18 DIAGNOSIS — Z23 Encounter for immunization: Secondary | ICD-10-CM | POA: Diagnosis not present

## 2023-07-18 DIAGNOSIS — Z131 Encounter for screening for diabetes mellitus: Secondary | ICD-10-CM

## 2023-07-18 MED ORDER — MELOXICAM 7.5 MG PO TABS: 7.5000 mg | ORAL_TABLET | Freq: Every day | ORAL | 1 refills | Status: DC

## 2023-07-18 NOTE — Progress Notes (Signed)
Subjective:  Patient ID: Brianna Jordan, female    DOB: 11-Feb-1986  Age: 37 y.o. MRN: [id number]  CC: Medical Management of Chronic Issues (Wakes up in pain every morning/SOB)   HPI Brianna Jordan is a 37 y.o. year old female with history of nicotine dependence here to establish care.  Interval History: Discussed the use of AI scribe software for clinical note transcription with the patient, who gave verbal consent to proceed.  She presents with chronic pain and shortness of breath. The chronic pain, which the patient has been experiencing for last 5 months, is described as all over the body, predominantly affecting the joints. The patient reports experiencing spasms in the hands, particularly while working, and cramps in the legs, feet, and hands. 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 The patient also reports some hair loss, but it is not significant.  She has experienced some cramping in her legs and hands.  Denies presence of facial rash.  The shortness of breath is a recent development, which the patient attributes to a COVID-19 infection contracted in November of the previous year. The patient describes difficulty with fast walking and climbing stairs, and experiences coughing spells when it's cold. The patient also mentions a history of smoking but has been clean for four years.        Past Medical History:  Diagnosis Date   Allergy    When its pollen season   Anxiety    reports dx from previous MD   Arthritis    Asthma    After i caught covid-19 in in 2023   GERD (gastroesophageal reflux disease)    Hypertension     Past Surgical History:  Procedure Laterality Date   APPENDECTOMY     CESAREAN SECTION     FOOT SURGERY  2021    Family History  Problem Relation Age of Onset   Anxiety disorder Mother    Diabetes Mother    Obesity Mother    Stroke Mother    Diabetes Maternal Grandfather     ADD / ADHD Daughter     Social History   Socioeconomic History   Marital status: Married    Spouse name: Not on file   Number of children: Not on file   Years of education: Not on file   Highest education level: 12th grade  Occupational History   Not on file  Tobacco Use   Smoking status: Former    Types: Cigarettes   Smokeless tobacco: Never  Vaping Use   Vaping status: Never Used  Substance and Sexual Activity   Alcohol use: Yes    Comment: social   Drug use: No   Sexual activity: Yes    Birth control/protection: Implant  Other Topics Concern   Not on file  Social History Narrative   Not on file   Social Determinants of Health   Financial Resource Strain: Medium Risk (07/17/2023)   Overall Financial Resource Strain (CARDIA)    Difficulty of Paying Living Expenses: Somewhat hard  Food Insecurity: Food Insecurity Present (07/17/2023)   Hunger Vital Sign    Worried About Running Out of Food in the Last Year: Sometimes true    Ran Out of Food in the Last Year: Sometimes true  Transportation Needs: No Transportation Needs (07/17/2023)   PRAPARE - Administrator, Civil Service (Medical): No    Lack of Transportation (Non-Medical): No  Physical Activity:  Insufficiently Active (07/17/2023)   Exercise Vital Sign    Days of Exercise per Week: 6 days    Minutes of Exercise per Session: 20 min  Stress: No Stress Concern Present (07/17/2023)   [employer] of Occupational Health - Occupational Stress Questionnaire    Feeling of Stress : Only a little  Social Connections: Moderately Isolated (07/17/2023)   Social Connection and Isolation Panel [NHANES]    Frequency of Communication with Friends and Family: Three times a week    Frequency of Social Gatherings with Friends and Family: Once a week    Attends Religious Services: Never    Database administrator or Organizations: No    Attends Engineer, structural: Not on file    Marital Status: Married     No Known Allergies  Outpatient Medications Prior to Visit  Medication Sig Dispense Refill   acetaminophen (TYLENOL) 325 MG tablet Take 650 mg by mouth every 6 (six) hours as needed.     cyclobenzaprine (FLEXERIL) 10 MG tablet Take 1 tablet (10 mg total) by mouth 3 (three) times daily as needed for muscle spasms. 20 tablet 0   docusate sodium (COLACE) 250 MG capsule Take 1 capsule (250 mg total) by mouth daily. (Patient not taking: Reported on 07/18/2023) 10 capsule 0   hydrocortisone (ANUSOL-HC) 25 MG suppository Place 1 suppository (25 mg total) rectally 2 (two) times daily. (Patient not taking: Reported on 07/18/2023) 12 suppository 0   ibuprofen (ADVIL) 800 MG tablet Take 1 tablet (800 mg total) by mouth 3 (three) times daily as needed for mild pain or moderate pain. (Patient not taking: Reported on 07/18/2023) 21 tablet 0   No facility-administered medications prior to visit.     ROS Review of Systems  Constitutional:  Negative for activity change and appetite change.  HENT:  Negative for sinus pressure and sore throat.   Respiratory:  Positive for shortness of breath. Negative for chest tightness and wheezing.   Cardiovascular:  Negative for chest pain and palpitations.  Gastrointestinal:  Negative for abdominal distention, abdominal pain and constipation.  Genitourinary: Negative.   Musculoskeletal:        See HPI  Psychiatric/Behavioral:  Negative for behavioral problems and dysphoric mood.     Objective:  BP 111/72   Pulse 84   Ht 5\' 6"  (1.676 m)   Wt 247 lb 12.8 oz (112.4 kg)   LMP 06/30/2023   SpO2 97%   BMI 40.00 kg/m      07/18/2023    9:43 AM 12/24/2022    6:04 PM 08/22/2021   12:28 PM  BP/Weight  Systolic BP 111 114 130  Diastolic BP 72 78 86  Wt. (Lbs) 247.8    BMI 40 kg/m2        Physical Exam Constitutional:      Appearance: She is well-developed.  Cardiovascular:     Rate and Rhythm: Normal rate.     Heart sounds: Normal heart sounds. No  murmur heard. Pulmonary:     Effort: Pulmonary effort is normal.     Breath sounds: Normal breath sounds. No wheezing or rales.  Chest:     Chest wall: No tenderness.  Abdominal:     General: Bowel sounds are normal. There is no distension.     Palpations: Abdomen is soft. There is no mass.     Tenderness: There is no abdominal tenderness.  Musculoskeletal:        General: Normal range of motion.  Right lower leg: No edema.     Left lower leg: No edema.  Neurological:     Mental Status: She is alert and oriented to person, place, and time.  Psychiatric:        Mood and Affect: Mood normal.        Latest Ref Rng & Units 09/06/2020    9:42 AM 07/10/2018   10:10 AM 06/25/2014    6:58 PM  CMP  Glucose 65 - 99 mg/dL 409  89  86   BUN 6 - 20 mg/dL 11  11  11    Creatinine 0.57 - 1.00 mg/dL 8.11  9.14  7.82   Sodium 134 - 144 mmol/L 137  140  139   Potassium 3.5 - 5.2 mmol/L 4.0  4.2  3.9   Chloride 96 - 106 mmol/L 102  103  103   CO2 20 - 29 mmol/L 22  22  22    Calcium 8.7 - 10.2 mg/dL 9.4  9.9  9.7   Total Protein 6.0 - 8.5 g/dL 8.4  7.8  8.7   Total Bilirubin 0.0 - 1.2 mg/dL 0.3  0.3  0.2   Alkaline Phos 44 - 121 IU/L 52  56  55   AST 0 - 40 IU/L 19  17  17    ALT 0 - 32 IU/L 13  13  15      Lipid Panel  No results found for: "CHOL", "TRIG", "HDL", "CHOLHDL", "VLDL", "LDLCALC", "LDLDIRECT"  CBC    Component Value Date/Time   WBC 6.1 09/06/2020 0942   WBC 7.4 06/25/2014 1858   RBC 4.16 09/06/2020 0942   RBC 4.31 06/25/2014 1858   HGB 11.8 09/06/2020 0942   HCT 36.2 09/06/2020 0942   PLT 274 09/06/2020 0942   MCV 87 09/06/2020 0942   MCH 28.4 09/06/2020 0942   MCH 29.0 06/25/2014 1858   MCHC 32.6 09/06/2020 0942   MCHC 33.8 06/25/2014 1858   RDW 12.8 09/06/2020 0942   LYMPHSABS 2.3 07/10/2018 1010   MONOABS 0.6 06/25/2014 1858   EOSABS 0.1 07/10/2018 1010   BASOSABS 0.0 07/10/2018 1010    No results found for: "HGBA1C"  Assessment & Plan:       Arthralgias Generalized joint pain, cramping, and spasms, particularly in hands, legs, and feet. Onset coincides with increased physical activity at work since May 2024. No known family history of rheumatoid or connective tissue disorders. -Order rheumatoid and connective tissue disorder panel. -Prescribe Meloxicam for pain relief.  Dyspnea History of COVID-19 infection in November 2023. Reports difficulty with stairs and exertion, occasional coughing spells. No current wheezing. Oxygen saturation is normal. -Order BNP to assess for heart failure. -Order chest x-ray to evaluate lung condition. -Consider further testing (echocardiogram, CT scan, pulmonology consult) if symptoms persist and initial tests are inconclusive.  General Health Maintenance -Administer influenza vaccine today.          Meds ordered this encounter  Medications   meloxicam (MOBIC) 7.5 MG tablet    Sig: Take 1 tablet (7.5 mg total) by mouth daily.    Dispense:  30 tablet    Refill:  1    Follow-up: No follow-ups on file.       Hoy Register, MD, FAAFP. [facility] [place], [place] [phone]   07/18/2023, 11:04 AM

## 2023-07-18 NOTE — Patient Instructions (Signed)
Shortness of Breath, Adult Shortness of breath is when a person has trouble breathing or when a person feels like she or he is having trouble breathing in enough air. Shortness of breath could be a sign of a medical problem. Follow these instructions at home:  Pollutants Do not use any products that contain nicotine or tobacco. These products include cigarettes, chewing tobacco, and vaping devices, such as e-cigarettes. This also includes cigars and pipes. If you need help quitting, ask your health care provider. Avoid things that can irritate your airways, including: Smoke. This includes campfire smoke, forest fire smoke, and secondhand smoke from tobacco products. Do not smoke or allow others to smoke in your home. Mold. Dust. Air pollution. Chemical fumes. Things that can give you an allergic reaction (allergens) if you have allergies. Common allergens include pollen from grasses or trees and animal dander. Keep your living space clean and free of mold and dust. General instructions Pay attention to any changes in your symptoms. Take over-the-counter and prescription medicines only as told by your health care provider. This includes oxygen therapy and inhaled medicines. Rest as needed. Return to your normal activities as told by your health care provider. Ask your health care provider what activities are safe for you. Keep all follow-up visits. This is important. Contact a health care provider if: Your condition does not improve as soon as expected. You have a hard time doing your normal activities, even after you rest. You have new symptoms. You cannot walk up stairs or exercise the way that you normally do. Get help right away if: Your shortness of breath gets worse. You have shortness of breath when you are resting. You feel light-headed or you faint. You have a cough that is not controlled with medicines. You cough up blood. You have pain with breathing. You have pain in your  chest, arms, shoulders, or abdomen. You have a fever. These symptoms may be an emergency. Get help right away. Call 911. Do not wait to see if the symptoms will go away. Do not drive yourself to the hospital. Summary Shortness of breath is when a person has trouble breathing enough air. It can be a sign of a medical problem. Avoid things that irritate your lungs, such as smoking, pollution, mold, and dust. Pay attention to changes in your symptoms and contact your health care provider if you have a hard time completing daily activities because of shortness of breath. This information is not intended to replace advice given to you by your health care provider. Make sure you discuss any questions you have with your health care provider. Document Revised: 06/03/2021 Document Reviewed: 06/03/2021 Elsevier Patient Education  2024 [employer].

## 2023-07-23 ENCOUNTER — Other Ambulatory Visit: Payer: Self-pay | Admitting: Family Medicine

## 2023-07-23 DIAGNOSIS — R768 Other specified abnormal immunological findings in serum: Secondary | ICD-10-CM

## 2023-07-23 DIAGNOSIS — M2550 Pain in unspecified joint: Secondary | ICD-10-CM

## 2023-07-23 LAB — CMP14+EGFR
ALT: 12 IU/L (ref 0–32)
AST: 21 IU/L (ref 0–40)
Albumin: 4 g/dL (ref 3.9–4.9)
Alkaline Phosphatase: 53 IU/L (ref 44–121)
BUN/Creatinine Ratio: 11 (ref 9–23)
BUN: 9 mg/dL (ref 6–20)
Bilirubin Total: 0.3 mg/dL (ref 0.0–1.2)
CO2: 21 mmol/L (ref 20–29)
Calcium: 9.4 mg/dL (ref 8.7–10.2)
Chloride: 102 mmol/L (ref 96–106)
Creatinine, Ser: 0.84 mg/dL (ref 0.57–1.00)
Globulin, Total: 4 g/dL (ref 1.5–4.5)
Glucose: 76 mg/dL (ref 70–99)
Potassium: 4.4 mmol/L (ref 3.5–5.2)
Sodium: 137 mmol/L (ref 134–144)
Total Protein: 8 g/dL (ref 6.0–8.5)
eGFR: 91 mL/min/{1.73_m2} (ref >59)

## 2023-07-23 LAB — FANA STAINING PATTERNS: Speckled Pattern: 1:80 {titer}

## 2023-07-23 LAB — ANA,IFA RA DIAG PNL W/RFLX TIT/PATN
Cyclic Citrullin Peptide Ab: 4 U (ref 0–19)
Cyclic Citrullin Peptide Ab: 4 U — AB (ref 0–19)
Rheumatoid fact SerPl-aCnc: <10 IU/mL (ref <14.0)

## 2023-07-23 LAB — HEMOGLOBIN A1C
Est. average glucose Bld gHb Est-mCnc: 111 mg/dL
Hgb A1c MFr Bld: 5.5 % (ref 4.8–5.6)

## 2023-07-23 LAB — PRO B NATRIURETIC PEPTIDE: NT-Pro BNP: 37 pg/mL (ref 0–130)

## 2023-07-23 LAB — ANTI-DNA ANTIBODY, DOUBLE-STRANDED: dsDNA Ab: <1 IU/mL (ref 0–9)

## 2023-07-23 LAB — SEDIMENTATION RATE: Sed Rate: 29 mm/hr (ref 0–32)

## 2023-07-23 LAB — ANTI-SMITH ANTIBODY: ENA SM Ab Ser-aCnc: 0.2 AI (ref 0.0–0.9)

## 2023-07-23 LAB — C-REACTIVE PROTEIN: CRP: 13 mg/L — ABNORMAL HIGH (ref 0–10)

## 2023-10-28 ENCOUNTER — Ambulatory Visit
Admission: RE | Admit: 2023-10-28 | Discharge: 2023-10-28 | Disposition: A | Discharge status: Home / Self Care | Payer: 59 | Source: Ambulatory Visit | Attending: Family Medicine | Admitting: Family Medicine

## 2023-10-28 DIAGNOSIS — R0600 Dyspnea, unspecified: Secondary | ICD-10-CM

## 2023-10-28 DIAGNOSIS — R918 Other nonspecific abnormal finding of lung field: Secondary | ICD-10-CM | POA: Diagnosis not present

## 2023-10-29 ENCOUNTER — Other Ambulatory Visit: Payer: Self-pay | Admitting: Family Medicine

## 2023-10-29 ENCOUNTER — Encounter: Payer: Self-pay | Admitting: Family Medicine

## 2023-10-29 DIAGNOSIS — R9389 Abnormal findings on diagnostic imaging of other specified body structures: Secondary | ICD-10-CM

## 2023-11-06 ENCOUNTER — Encounter: Payer: Self-pay | Admitting: Family Medicine

## 2023-11-19 ENCOUNTER — Ambulatory Visit
Admission: RE | Admit: 2023-11-19 | Discharge: 2023-11-19 | Disposition: A | Discharge status: Home / Self Care | Payer: 59 | Source: Ambulatory Visit | Attending: Family Medicine | Admitting: Family Medicine

## 2023-11-19 DIAGNOSIS — R0602 Shortness of breath: Secondary | ICD-10-CM | POA: Diagnosis not present

## 2023-11-19 DIAGNOSIS — J439 Emphysema, unspecified: Secondary | ICD-10-CM | POA: Diagnosis not present

## 2023-11-19 DIAGNOSIS — R918 Other nonspecific abnormal finding of lung field: Secondary | ICD-10-CM | POA: Diagnosis not present

## 2023-11-19 DIAGNOSIS — R9389 Abnormal findings on diagnostic imaging of other specified body structures: Secondary | ICD-10-CM

## 2023-11-22 ENCOUNTER — Telehealth (HOSPITAL_COMMUNITY): Payer: Self-pay | Admitting: *Deleted

## 2023-11-22 ENCOUNTER — Ambulatory Visit (HOSPITAL_COMMUNITY)
Admission: EM | Admit: 2023-11-22 | Discharge: 2023-11-22 | Disposition: A | Discharge status: Home / Self Care | Payer: 59 | Attending: Emergency Medicine | Admitting: Emergency Medicine

## 2023-11-22 ENCOUNTER — Encounter (HOSPITAL_COMMUNITY): Payer: Self-pay | Admitting: Emergency Medicine

## 2023-11-22 DIAGNOSIS — A059 Bacterial foodborne intoxication, unspecified: Secondary | ICD-10-CM | POA: Diagnosis not present

## 2023-11-22 MED ORDER — ONDANSETRON 4 MG PO TBDP
ORAL_TABLET | ORAL | Status: AC
  Filled 2023-11-22: qty 1

## 2023-11-22 MED ORDER — ONDANSETRON 4 MG PO TBDP
4.0000 mg | ORAL_TABLET | Freq: Once | ORAL | Status: AC
  Administered 2023-11-22: 4 mg via ORAL

## 2023-11-22 MED ORDER — ONDANSETRON 8 MG PO TBDP: 8.0000 mg | ORAL_TABLET | Freq: Three times a day (TID) | ORAL | 0 refills | Status: DC | PRN

## 2023-11-22 NOTE — Telephone Encounter (Signed)
Called pharmacy and they state pts insurance is terminated they can apply to a discount card.   I have advised pt and she states that she will take new insurance card to pharmacy.

## 2023-11-22 NOTE — ED Provider Notes (Signed)
[facility]    CSN: [id number] Arrival date & time: 11/22/23  1138      History   Chief Complaint Chief Complaint  Patient presents with   Emesis   Diarrhea   Headache    HPI Brianna Jordan is a 38 y.o. female.   Patient presents to clinic for nausea, emesis and diarrhea that started last night. Patient reports it was due to bad food at the cafeteria, had spaghetti and meatballs, symptoms started shortly after this. Last diarrhea while in clinic and last emesis an hour or so PTA. Had trouble keeping water down so she came in to clinic. Mild headache.   No blood in stool or emesis. Has not tried any medications or interventions. No recent sick contacts. No fevers, no cough or congestion.  Drinking a blue Gatorade in clinic.   The history is provided by the patient and medical records.  Emesis Diarrhea Headache   Past Medical History:  Diagnosis Date   Allergy    When its pollen season   Anxiety    reports dx from previous MD   Arthritis    Asthma    After i caught covid-19 in in 2023   GERD (gastroesophageal reflux disease)    Hypertension     Patient Active Problem List   Diagnosis Date Noted   Nexplanon in place 09/06/2020   BMI 40.0-44.9, adult (HCC) 09/06/2020    Past Surgical History:  Procedure Laterality Date   APPENDECTOMY     CESAREAN SECTION     FOOT SURGERY  2021    OB History     Gravida  2   Para  1   Term  1   Preterm      AB      Living  2      SAB      IAB      Ectopic      Multiple      Live Births  2            Home Medications    Prior to Admission medications   Medication Sig Start Date End Date Taking? Authorizing Provider  ondansetron (ZOFRAN-ODT) 8 MG disintegrating tablet Take 1 tablet (8 mg total) by mouth every 8 (eight) hours as needed. 11/22/23  Yes Rinaldo Ratel, [place], FNP  acetaminophen (TYLENOL) 325 MG tablet Take 650 mg by mouth every 6 (six) hours as needed.    [provider]    Family History Family History  Problem Relation Age of Onset   Anxiety disorder Mother    Diabetes Mother    Obesity Mother    Stroke Mother    Diabetes Maternal Grandfather    ADD / ADHD Daughter     Social History Social History   Tobacco Use   Smoking status: Former    Types: Cigarettes   Smokeless tobacco: Never  Vaping Use   Vaping status: Never Used  Substance Use Topics   Alcohol use: Yes    Comment: social   Drug use: No     Allergies   Patient has no known allergies.   Review of Systems Review of Systems  Per HPI   Physical Exam Triage Vital Signs ED Triage Vitals  Encounter Vitals Group     BP 11/22/23 1208 111/75     Systolic BP Percentile --      Diastolic BP Percentile --      Pulse Rate 11/22/23 1208 (!) 110  Resp 11/22/23 1208 18     Temp 11/22/23 1208 98.3 F (36.8 C)     Temp Source 11/22/23 1208 Oral     SpO2 11/22/23 1208 99 %     Weight --      Height --      Head Circumference --      Peak Flow --      Pain Score 11/22/23 1206 8     Pain Loc --      Pain Education --      Exclude from Growth Chart --    No data found.  Updated Vital Signs BP 111/75 (BP Location: Right Arm)   Pulse (!) 110   Temp 98.3 F (36.8 C) (Oral)   Resp 18   LMP 11/01/2023   SpO2 99%   Visual Acuity Right Eye Distance:   Left Eye Distance:   Bilateral Distance:    Right Eye Near:   Left Eye Near:    Bilateral Near:     Physical Exam Vitals and nursing note reviewed.  Constitutional:      Appearance: Normal appearance. She is well-developed.  HENT:     Head: Normocephalic and atraumatic.     Right Ear: External ear normal.     Left Ear: External ear normal.     Nose: Nose normal.     Mouth/Throat:     Mouth: Mucous membranes are moist.  Eyes:     General: No scleral icterus. Cardiovascular:     Rate and Rhythm: Normal rate and regular rhythm.     Heart sounds: Normal heart sounds. No murmur  heard. Pulmonary:     Effort: Pulmonary effort is normal. No respiratory distress.     Breath sounds: Normal breath sounds.  Musculoskeletal:        General: Normal range of motion.  Skin:    General: Skin is warm and dry.  Neurological:     General: No focal deficit present.     Mental Status: She is alert and oriented to person, place, and time.     GCS: GCS eye subscore is 4. GCS verbal subscore is 5. GCS motor subscore is 6.  Psychiatric:        Mood and Affect: Mood normal.        Behavior: Behavior normal.      UC Treatments / Results  Labs (all labs ordered are listed, but only abnormal results are displayed) Labs Reviewed - No data to display  EKG   Radiology No results found.  Procedures Procedures (including critical care time)  Medications Ordered in UC Medications  ondansetron (ZOFRAN-ODT) disintegrating tablet 4 mg (4 mg Oral Given 11/22/23 1237)    Initial Impression / Assessment and Plan / UC Course  I have reviewed the triage vital signs and the nursing notes.  Pertinent labs & imaging results that were available during my care of the patient were reviewed by me and considered in my medical decision making (see chart for details).  Vitals and triage reviewed, patient is hemodynamically stable.  Lungs are vesicular, heart with regular rate and rhythm.  Tolerating Gatorade in clinic, nausea improved after ODT Zofran and continues to tolerate fluids.  Suspect food poisoning due to spaghetti ingestion.  Gradual diet progression and return precautions given, patient verbalized understanding, no questions at this time.  Work note provided.     Final Clinical Impressions(s) / UC Diagnoses   Final diagnoses:  Food poisoning     Discharge Instructions  Rehydrate gradually with water, ginger ale may help to settle your stomach.  Gatorade will have electrolytes in it.  If you tolerate this he can move to broths and soups.  If this goes well you can  progress to solids such as bananas, rice, toast, applesauce and other bland nonfried and nonspicy foods.  Return to clinic for any new or urgent symptoms.     ED Prescriptions     Medication Sig Dispense Auth. Provider   ondansetron (ZOFRAN-ODT) 8 MG disintegrating tablet Take 1 tablet (8 mg total) by mouth every 8 (eight) hours as needed. 20 tablet Dalyn Kjos, [place], [place]      PDMP not reviewed this encounter.   Clark Clowdus, [place] N, [place] 11/22/23 [phone]

## 2023-11-22 NOTE — Discharge Instructions (Addendum)
Rehydrate gradually with water, ginger ale may help to settle your stomach.  Gatorade will have electrolytes in it.  If you tolerate this he can move to broths and soups.  If this goes well you can progress to solids such as bananas, rice, toast, applesauce and other bland nonfried and nonspicy foods.  Return to clinic for any new or urgent symptoms.

## 2023-11-22 NOTE — ED Triage Notes (Signed)
Patient c/o diarrhea, vomiting and headache x one day.

## 2023-12-02 ENCOUNTER — Encounter: Payer: Self-pay | Admitting: Family Medicine

## 2023-12-02 ENCOUNTER — Other Ambulatory Visit: Payer: Self-pay | Admitting: Family Medicine

## 2023-12-02 DIAGNOSIS — D869 Sarcoidosis, unspecified: Secondary | ICD-10-CM

## 2023-12-02 DIAGNOSIS — J438 Other emphysema: Secondary | ICD-10-CM

## 2023-12-02 HISTORY — DX: Other emphysema: J43.8

## 2023-12-02 MED ORDER — ALBUTEROL SULFATE HFA 108 (90 BASE) MCG/ACT IN AERS
2.0000 | INHALATION_SPRAY | Freq: Four times a day (QID) | RESPIRATORY_TRACT | 2 refills | Status: AC | PRN
Start: 2023-12-02 — End: ?

## 2023-12-02 MED ORDER — TIOTROPIUM BROMIDE MONOHYDRATE 18 MCG IN CAPS: 18.0000 ug | ORAL_CAPSULE | Freq: Every day | RESPIRATORY_TRACT | 2 refills | Status: DC

## 2023-12-31 ENCOUNTER — Ambulatory Visit: Payer: 59 | Attending: Internal Medicine | Admitting: Internal Medicine

## 2023-12-31 ENCOUNTER — Encounter: Payer: Self-pay | Admitting: Internal Medicine

## 2023-12-31 VITALS — BP 102/70 | HR 93 | Resp 14 | Ht 66.0 in | Wt 234.0 lb

## 2023-12-31 DIAGNOSIS — E559 Vitamin D deficiency, unspecified: Secondary | ICD-10-CM

## 2023-12-31 DIAGNOSIS — R768 Other specified abnormal immunological findings in serum: Secondary | ICD-10-CM | POA: Diagnosis not present

## 2023-12-31 DIAGNOSIS — R21 Rash and other nonspecific skin eruption: Secondary | ICD-10-CM

## 2023-12-31 DIAGNOSIS — M2550 Pain in unspecified joint: Secondary | ICD-10-CM

## 2023-12-31 NOTE — Progress Notes (Signed)
 Office Visit Note  Patient: Brianna Jordan             Date of Birth: December 26, 1985           MRN: [id number]             PCP: Hoy Register, MD Referring: Hoy Register, MD Visit Date: 12/31/2023   Subjective:  New Patient (Initial Visit) (Patient states she has joint pain in her knees, fingers, and right shoulder. Patient states sometimes when she was cleaning her fingers would lock up. )   Discussed the use of AI scribe software for clinical note transcription with the patient, who gave verbal consent to proceed.  History of Present Illness   Brianna Jordan is a 38 year old female here for evaluation of positive ANA checked associated with joint pain in multiple areas.   She experiences persistent, generalized joint pain, describing it as akin to 'being hit by a bus'. Her hands cramp, making tasks like doing her hair difficult, and her fingers sometimes get stuck, requiring massage for relief. Her knees have been problematic since early 2023 when her right knee popped and swelled, improving after four months of physical therapy. Although the swelling was a more isolated event, she now experiences knee pain with prolonged standing and during rainy weather. Her feet feel as though they have been 'ran over twice'. She has been taking Tylenol, which is no longer effective, and discontinued meloxicam due to bleeding concerns.  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 She experiences skin rashes, particularly on her hands and face, and uses moisturizers and a humidifier to manage symptoms.  She reports intermittent numbness, with her arm going to sleep if she lies on one side too long, and frequent cramping in her foot. She experiences constipation and diarrhea, which she manages by eating vegetables.  She had COVID-19 in 2023, initially presenting with mild symptoms but later leading to significant breathing difficulties.  She was diagnosed with emphysema, possibly related to her past smoking habit, which she quit several years ago. 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  She had lymph node swelling as indicated by her CT scan, but she does not regularly noticed nodules in her face and neck but doesn't check for it.  She experiences weakness on her right side and ankle swelling, with a sensation of a bone shifting in her foot, which has a history of surgery.  She has lost weight unintentionally, about 13 lbs less today compared to last PCP appointment, which she thinks has helped alleviate some joint symptoms. She is attempting to maintain a healthy diet rich in vegetables to manage gastrointestinal symptoms.   Labs reviewed 06/2023 ANA 1:80 speckled dsDNA, Sm neg RF neg CCP neg ESR wnl CRP 13  Imaging reviewed 11/19/23 HRCT Chest IMPRESSION: 1. Mildly coarsened peribronchovascular ill-defined nodularity and ground-glass in a craniocaudal gradient with nodularity along the fissures. Findings may be due to sarcoid, especially given small to borderline enlarged mediastinal lymph nodes. 2.  Emphysema (ICD10-J43.9).  Activities of Daily Living:  Patient reports morning stiffness for 5-10 minutes.   Patient Reports nocturnal pain.  Difficulty dressing/grooming: Reports Difficulty climbing stairs: Reports Difficulty getting out of chair: Denies Difficulty using hands for taps, buttons, cutlery, and/or writing: Reports  Review of Systems  Constitutional:  Positive for fatigue.  HENT:  Positive for mouth dryness. Negative for mouth sores.  Eyes:  Positive for dryness.  Respiratory:  Positive for shortness of breath.   Cardiovascular:  Positive for chest pain. Negative for palpitations.  Gastrointestinal:  Positive for constipation. Negative for blood in stool and diarrhea.  Endocrine: Negative for increased urination.  Genitourinary:  Positive for  involuntary urination.  Musculoskeletal:  Positive for joint pain, gait problem, joint pain, joint swelling, myalgias, muscle weakness, morning stiffness, muscle tenderness and myalgias.  Skin:  Positive for hair loss and sensitivity to sunlight. Negative for color change and rash.  Allergic/Immunologic: Positive for susceptible to infections.  Neurological:  Positive for dizziness and headaches.  Hematological:  Negative for swollen glands.  Psychiatric/Behavioral:  Positive for depressed mood. Negative for sleep disturbance. The patient is nervous/anxious.     PMFS History:  Patient Active Problem List   Diagnosis Date Noted   Positive ANA (antinuclear antibody) 12/31/2023   Polyarthralgia 12/31/2023   Vitamin D deficiency 12/31/2023   Other emphysema (HCC) 12/02/2023   Nexplanon in place 09/06/2020   BMI 40.0-44.9, adult (HCC) 09/06/2020    Past Medical History:  Diagnosis Date   Allergy    When its pollen season   Anxiety    reports dx from previous MD   Arthritis    Asthma    After i caught covid-19 in in 2023   GERD (gastroesophageal reflux disease)    Hypertension    Other emphysema (HCC) 12/02/2023    Family History  Problem Relation Age of Onset   Anxiety disorder Mother    Diabetes Mother    Obesity Mother    Stroke Mother    Diabetes Maternal Grandfather    ADD / ADHD Daughter    Past Surgical History:  Procedure Laterality Date   APPENDECTOMY     CESAREAN SECTION     FOOT SURGERY  2021   Social History   Social History Narrative   Not on file   Immunization History  Administered Date(s) Administered   Influenza, Seasonal, Injecte, Preservative Fre 07/18/2023   PFIZER(Purple Top)SARS-COV-2 Vaccination 01/18/2020, 02/08/2020     Objective: Vital Signs: BP 102/70 (BP Location: Right Arm, Patient Position: Sitting, Cuff Size: Large)   Pulse 93   Resp 14   Ht 5\' 6"  (1.676 m)   Wt 234 lb (106.1 kg)   LMP 12/20/2023   BMI 37.77 kg/m    Physical  Exam Constitutional:      Appearance: She is obese.  HENT:     Mouth/Throat:     Comments: Geographic tongue appearance Eyes:     Conjunctiva/sclera: Conjunctivae normal.  Cardiovascular:     Rate and Rhythm: Normal rate and regular rhythm.  Pulmonary:     Effort: Pulmonary effort is normal.     Breath sounds: Normal breath sounds.  Musculoskeletal:     Right lower leg: No edema.     Left lower leg: No edema.  Lymphadenopathy:     Cervical: No cervical adenopathy.  Skin:    General: Skin is warm and dry.     Findings: Rash present.     Comments: Hyperpigmented skin changes on dorsal side of fingers and hands  Neurological:     Mental Status: She is alert.  Psychiatric:        Mood and Affect: Mood normal.      Musculoskeletal Exam:  Shoulders full ROM no tenderness or swelling Tenderness to pressure and some pain with full range of motion and trapezius, levator scapula, and rhomboid muscle groups Elbows full ROM no  tenderness or swelling Wrists full ROM, nodule or faint swelling on dorsal side left forearm just proximal to wrist, no tenderness to pressure Fingers full ROM no tenderness or swelling Low back midline and paraspinal muscle tenderness to pressure without radiation Lateral hip tenderness to pressure and lateral hip pain provoked with internal rotation Knees full ROM no tenderness or swelling Ankles full ROM no tenderness or swelling First MTP joints with some bony nodularity or prominence on plantar side, callus along medial border at first MTP and on plantar side at fifth MTP   Investigation: No additional findings.  Imaging: No results found.  Recent Labs: Lab Results  Component Value Date   WBC 6.1 09/06/2020   HGB 11.8 09/06/2020   PLT 274 09/06/2020   NA 137 07/18/2023   K 4.4 07/18/2023   CL 102 07/18/2023   CO2 21 07/18/2023   GLUCOSE 76 07/18/2023   BUN 9 07/18/2023   CREATININE 0.84 07/18/2023   BILITOT 0.3 07/18/2023   ALKPHOS 53  07/18/2023   AST 21 07/18/2023   ALT 12 07/18/2023   PROT 8.0 07/18/2023   ALBUMIN 4.0 07/18/2023   CALCIUM 9.4 07/18/2023   GFRAA 94 09/06/2020    Speciality Comments: No specialty comments available.  Procedures:  No procedures performed Allergies: Bee pollen and Dust mite extract   Assessment / Plan:     Visit Diagnoses: Positive ANA (antinuclear antibody) - Plan: RNP Antibody, Sjogrens syndrome-A extractable nuclear antibody, Sjogrens syndrome-B extractable nuclear antibody, C3 and C4, Sedimentation rate, C-reactive protein, Angiotensin converting enzyme There are no definitive inflammatory changes on exam. -Checking additional extractable nuclear antibodies as detailed above  -Checking sed rate CRP and complements for inflammatory markers. -Checking ACE level for screening given lung nodules.   Polyarthralgia Osteoarthritis Chronic joint pain with swelling and stiffness described, although there is no peripheral synovitis on exam today.  Suspect probable mild osteoarthritis of several areas given patellofemoral crepitus and MTP joint changes. -Provided printed handout information on supplemental and topical treatments with poor NSAID tolerance for osteoarthritis - Consider referral to podiatrist for evaluation first MTP pain and callus if severe. - Discuss potential use of shoe inserts to distribute pressure on joints.  Vitamin D deficiency - Plan: Vitamin D 1,25 dihydroxy, VITAMIN D 25 Hydroxy (Vit-D Deficiency, Fractures) Checking storage and activated form of vitamin D for assessment evaluating for possible sarcoidosis or other granulomatous disease given nodule findings.  Rash Chronic skin condition exacerbated by occupational exposure and winter season.  She reports a history of psoriasis although current findings and history would be very typical for eczema. - Continue use of moisturizers such as Cetaphil or Cerave. - Use a humidifier in the bedroom to maintain skin  moisture. - Consider barrier creams like Vaseline at night.  Emphysema Post-COVID respiratory issues with shortness of breath and decreased exercise tolerance, possibly exacerbated by smoking. - Refer to pulmonologist for further evaluation and management. - Advise carrying an inhaler during physical activity.    Orders: Orders Placed This Encounter  Procedures   RNP Antibody   Sjogrens syndrome-A extractable nuclear antibody   Sjogrens syndrome-B extractable nuclear antibody   C3 and C4   Sedimentation rate   C-reactive protein   Angiotensin converting enzyme   Vitamin D 1,25 dihydroxy   VITAMIN D 25 Hydroxy (Vit-D Deficiency, Fractures)   No orders of the defined types were placed in this encounter.    Follow-Up Instructions: No follow-ups on file.   Fuller Plan, MD  Note -  This record has been created using [employer].  Chart creation errors have been sought, but may not always  have been located. Such creation errors do not reflect on  the standard of medical care.

## 2024-01-04 LAB — RNP ANTIBODY: Ribonucleic Protein(ENA) Antibody, IgG: 1.1 AI — AB

## 2024-01-04 LAB — C-REACTIVE PROTEIN: CRP: 11.5 mg/L — ABNORMAL HIGH (ref <8.0)

## 2024-01-04 LAB — SJOGRENS SYNDROME-A EXTRACTABLE NUCLEAR ANTIBODY: SSA (Ro) (ENA) Antibody, IgG: <1 AI

## 2024-01-04 LAB — VITAMIN D 1,25 DIHYDROXY
Vitamin D 1, 25 (OH)2 Total: 40 pg/mL (ref 18–72)
Vitamin D2 1, 25 (OH)2: <8 pg/mL
Vitamin D3 1, 25 (OH)2: 40 pg/mL

## 2024-01-04 LAB — SJOGRENS SYNDROME-B EXTRACTABLE NUCLEAR ANTIBODY: SSB (La) (ENA) Antibody, IgG: <1 AI

## 2024-01-04 LAB — VITAMIN D 25 HYDROXY (VIT D DEFICIENCY, FRACTURES): Vit D, 25-Hydroxy: 11 ng/mL — ABNORMAL LOW (ref 30–100)

## 2024-01-04 LAB — ANGIOTENSIN CONVERTING ENZYME: Angiotensin-Converting Enzyme: 41 U/L (ref 9–67)

## 2024-01-04 LAB — C3 AND C4
C3 Complement: 150 mg/dL (ref 83–193)
C4 Complement: 18 mg/dL (ref 15–57)

## 2024-01-04 LAB — SEDIMENTATION RATE: Sed Rate: 56 mm/h — ABNORMAL HIGH (ref 0–20)

## 2024-01-07 ENCOUNTER — Encounter: Payer: Self-pay | Admitting: Obstetrics and Gynecology

## 2024-01-07 ENCOUNTER — Ambulatory Visit (INDEPENDENT_AMBULATORY_CARE_PROVIDER_SITE_OTHER): Payer: 59 | Admitting: Obstetrics and Gynecology

## 2024-01-07 VITALS — BP 106/72 | HR 77 | Ht 66.0 in | Wt 232.0 lb

## 2024-01-07 DIAGNOSIS — Z3009 Encounter for other general counseling and advice on contraception: Secondary | ICD-10-CM

## 2024-01-07 DIAGNOSIS — Z3046 Encounter for surveillance of implantable subdermal contraceptive: Secondary | ICD-10-CM

## 2024-01-07 DIAGNOSIS — F419 Anxiety disorder, unspecified: Secondary | ICD-10-CM

## 2024-01-07 DIAGNOSIS — Z1339 Encounter for screening examination for other mental health and behavioral disorders: Secondary | ICD-10-CM | POA: Diagnosis not present

## 2024-01-07 MED ORDER — ETONOGESTREL 68 MG ~~LOC~~ IMPL
68.0000 mg | DRUG_IMPLANT | Freq: Once | SUBCUTANEOUS | Status: AC
  Administered 2024-01-07: 68 mg via SUBCUTANEOUS

## 2024-01-07 NOTE — Progress Notes (Addendum)
 38 y.o. New GYN presents for Nexplanon removal and Insertion.  Last PAP 10/16/2018.    Administrations This Visit     etonogestrel (NEXPLANON) implant 68 mg     Admin Date 01/07/2024 Action Given Dose 68 mg Route Subdermal Documented By Maretta Bees, RMA

## 2024-01-07 NOTE — Progress Notes (Signed)
 NEW GYNECOLOGY PATIENT Patient name: Brianna Jordan MRN [id number]  Date of birth: Feb 07, 1986 Chief Complaint:   Contraception (Nexplanon removal and Insertion)     History:  Brianna Jordan is a 38 y.o. G2P1002 being seen today for nexplanon exchange. Has completed childbearing and partner is considering vasectomy. Bleeding pattern irregular but tolerable.      Gynecologic History Patient's last menstrual period was 12/20/2023 (exact date). Contraception: Nexplanon Last Pap:     Component Value Date/Time   DIAGPAP  10/16/2018 0000    NEGATIVE FOR INTRAEPITHELIAL LESIONS OR MALIGNANCY.   ADEQPAP  10/16/2018 0000    Satisfactory for evaluation  endocervical/transformation zone component PRESENT.    High Risk HPV: Positive  Adequacy:  Satisfactory for evaluation, transformation zone component PRESENT  Diagnosis:  Atypical squamous cells of undetermined significance (ASC-US)  Last Mammogram: n/a Last Colonoscopy: n/a  Obstetric History OB History  Gravida Para Term Preterm AB Living  2 1 1   2   SAB IAB Ectopic Multiple Live Births      2    # Outcome Date GA Lbr Len/2nd Weight Sex Type Anes PTL Lv  2 Term 08/17/10     CS-LTranv   LIV  1 Gravida 04/23/06     Vag-Spont   LIV    Past Medical History:  Diagnosis Date   Allergy    When its pollen season   Anxiety    reports dx from previous MD   Arthritis    Asthma    After i caught covid-19 in in 2023   GERD (gastroesophageal reflux disease)    Hypertension    Other emphysema (HCC) 12/02/2023    Past Surgical History:  Procedure Laterality Date   APPENDECTOMY     CESAREAN SECTION     FOOT SURGERY  2021    Current Outpatient Medications on File Prior to Visit  Medication Sig Dispense Refill   acetaminophen (TYLENOL) 325 MG tablet Take 650 mg by mouth every 6 (six) hours as needed.     albuterol (VENTOLIN HFA) 108 (90 Base) MCG/ACT inhaler Inhale 2 puffs into the lungs every 6 (six) hours as needed for  wheezing or shortness of breath. 8 g 2   benzonatate (TESSALON) 100 MG capsule Take by mouth. (Patient not taking: Reported on 12/31/2023)     Multiple Vitamin (MULTIVITAMIN PO) Take by mouth.     ondansetron (ZOFRAN-ODT) 8 MG disintegrating tablet Take 1 tablet (8 mg total) by mouth every 8 (eight) hours as needed. (Patient not taking: Reported on 12/31/2023) 20 tablet 0   tiotropium (SPIRIVA HANDIHALER) 18 MCG inhalation capsule Place 1 capsule (18 mcg total) into inhaler and inhale daily. 30 capsule 2   No current facility-administered medications on file prior to visit.    Allergies  Allergen Reactions   Bee Pollen    Dust Mite Extract     Social History:  reports that she has quit smoking. Her smoking use included cigarettes. She has been exposed to tobacco smoke. She has never used smokeless tobacco. She reports current alcohol use. She reports that she does not use drugs.  Family History  Problem Relation Age of Onset   Anxiety disorder Mother    Diabetes Mother    Obesity Mother    Stroke Mother    ADD / ADHD Daughter    Cancer Maternal Aunt    Diabetes Maternal Grandfather     The following portions of the patient's history were reviewed and updated as appropriate: allergies, current  medications, past family history, past medical history, past social history, past surgical history and problem list.  Review of Systems Pertinent items noted in HPI and remainder of comprehensive ROS otherwise negative.  Physical Exam:  BP 106/72   Pulse 77   Ht 5\' 6"  (1.676 m)   Wt 232 lb (105.2 kg)   LMP 12/20/2023 (Exact Date)   BMI 37.45 kg/m  Physical Exam     01/07/2024    3:16 PM 07/18/2023    9:44 AM 07/10/2018   10:36 AM  GAD 7 : Generalized Anxiety Score  Nervous, Anxious, on Edge 1 0 2  Control/stop worrying 1 1 3   Worry too much - different things 3 3 3   Trouble relaxing 1 0 2  Restless 1 1 2   Easily annoyed or irritable 3 3 3   Afraid - awful might happen 1 0 0  Total  GAD 7 Score 11 8 15   Anxiety Difficulty Not difficult at all          01/07/2024    3:15 PM 07/18/2023    9:44 AM 07/10/2018   10:36 AM  Depression screen PHQ 2/9  Decreased Interest 0 1 0  Down, Depressed, Hopeless 0 0 0  PHQ - 2 Score 0 1 0  Altered sleeping 0 0 1  Tired, decreased energy 3 3 3   Change in appetite 0 0 2  Feeling bad or failure about yourself  0 0 0  Trouble concentrating 1 0 2  Moving slowly or fidgety/restless 0 0 3  Suicidal thoughts 0 0 0  PHQ-9 Score 4 4 11   Difficult doing work/chores Not difficult at all       Nexplanon Removal and Reinsertion Patient identified, informed consent performed, consent signed.   Patient does understand that irregular bleeding is a very common side effect of this medication. She was advised to have backup contraception for one week after replacement of the implant.   Appropriate time out taken. Nexplanon site identified in left arm.  Area prepped in usual sterile fashon. 1ml of 1% lidocaine with epinephrine was used to anesthetize the area at the distal end of the implant. A small stab incision was made right beside the implant on the distal portion. The Nexplanon rod was removed without difficulty. There was minimal blood loss. There were no complications. Area was then injected with 2 ml of 1 % lidocaine. She was re-prepped with betadine, Nexplanon removed from packaging, Device confirmed in needle, then inserted full length of needle and withdrawn per handbook instructions. Nexplanon was able to palpated in the patient's arm; patient palpated the insert herself.  There was minimal blood loss. Patient insertion site covered with gauze and a pressure bandage to reduce any bruising. The patient tolerated the procedure well and was given post procedure instructions.      Assessment and Plan:   1. Encounter for removal and reinsertion of Nexplanon (Primary) Now s/p uncomplicated nexplanon removal and insertion. Discussed bleeding pattern  may or may not remain the same with new nexplanon.  - etonogestrel (NEXPLANON) implant 68 mg  2. Anxiety Elevated GAD7, IBH referral placed - Ambulatory referral to [facility]   3. Encounter for counseling regarding contraception Patient proceeded with nexplanon exchange today and hoping partner will get vasectomy. Discussed locations where vasectomy can be completed.   Will return to complete annual with pap  Routine preventative health maintenance measures emphasized. Please refer to After Visit Summary for other counseling recommendations.   Follow-up: No  follow-ups on file.      Lorriane Shire, MD Obstetrician & Gynecologist, [facility] for [employer], [facility]

## 2024-01-08 MED ORDER — VITAMIN D (ERGOCALCIFEROL) 1.25 MG (50000 UNIT) PO CAPS: 50000.0000 [IU] | ORAL_CAPSULE | ORAL | 0 refills | Status: DC

## 2024-01-08 NOTE — Addendum Note (Signed)
 Addended by: Fuller Plan on: 01/08/2024 09:12 AM   Modules accepted: Orders

## 2024-01-08 NOTE — Progress Notes (Signed)
 Her antibody tests were negative for any evidence of lupus.  The RNP was a borderline result of 1.1 but I do not think this is significant. Sedimentation rate was high at 56 and CRP high at 11.5 indicating some inflammation.  None of the other tests specifically suggesting sarcoidosis came back positive.  Her vitamin D was very low at 11 this should be greater than 30.  I will send a prescription for a high-dose vitamin D supplement to take once weekly.  Because of her abnormal results we will need to schedule a follow-up appointment.

## 2024-02-10 ENCOUNTER — Encounter: Payer: Self-pay | Admitting: Obstetrics & Gynecology

## 2024-02-10 ENCOUNTER — Ambulatory Visit: Admitting: Obstetrics & Gynecology

## 2024-02-10 ENCOUNTER — Other Ambulatory Visit (HOSPITAL_COMMUNITY)
Admission: RE | Admit: 2024-02-10 | Discharge: 2024-02-10 | Disposition: A | Discharge status: Home / Self Care | Source: Ambulatory Visit | Attending: Obstetrics & Gynecology | Admitting: Obstetrics & Gynecology

## 2024-02-10 VITALS — BP 113/77 | HR 108 | Ht 66.0 in | Wt 233.0 lb

## 2024-02-10 DIAGNOSIS — N760 Acute vaginitis: Secondary | ICD-10-CM | POA: Diagnosis not present

## 2024-02-10 DIAGNOSIS — R102 Pelvic and perineal pain: Secondary | ICD-10-CM

## 2024-02-10 DIAGNOSIS — R634 Abnormal weight loss: Secondary | ICD-10-CM | POA: Diagnosis not present

## 2024-02-10 DIAGNOSIS — Z01419 Encounter for gynecological examination (general) (routine) without abnormal findings: Secondary | ICD-10-CM

## 2024-02-10 DIAGNOSIS — Z1231 Encounter for screening mammogram for malignant neoplasm of breast: Secondary | ICD-10-CM | POA: Diagnosis not present

## 2024-02-10 DIAGNOSIS — B9689 Other specified bacterial agents as the cause of diseases classified elsewhere: Secondary | ICD-10-CM | POA: Diagnosis not present

## 2024-02-10 NOTE — Progress Notes (Addendum)
 GYNECOLOGY ANNUAL PREVENTATIVE CARE ENCOUNTER NOTE  History:     Brianna Jordan is a 38 y.o. G60P1002 female with a history of emphysema here for a routine annual gynecologic exam.  Current complaints: painful and irritating vaginal dryness. She occasionally will try oils or jellies but doesn't prefer to use them.  She also endorses some light spotting after her normal period that was a week long and occurring after her Nexplanon exchange. She also has had unintentional weight loss over the past few months which she believes is almost 30 pounds. This began after she had a bout of food poisoning in late January. She has an appetite but when it comes time to eat she feels she can only eat half of her meals. She endorses night sweats though she says she can be sweaty all the time. She denies bowel or bladder dysfunction but does note occasional diarrhea not occurring every day. She has family history of breast cancer (aunts, cousins and no first degree relatives) though no history of ovarian cancer.   Of note, permission was obtained from patient to have a medical student present during this encounter and to participate in the physical examination.  Gynecologic History Patient's last menstrual period was 01/13/2024. Contraception: Nexplanon Last Pap: 10/16/2018. Result was normal with negative HPV Last Mammogram: never, age<40 Last Colonoscopy: N/A  Obstetric History OB History  Gravida Para Term Preterm AB Living  2 1 1   2   SAB IAB Ectopic Multiple Live Births      2    # Outcome Date GA Lbr Len/2nd Weight Sex Type Anes PTL Lv  2 Term 08/17/10     CS-LTranv   LIV  1 Gravida 04/23/06     Vag-Spont   LIV    Past Medical History:  Diagnosis Date   Allergy    When its pollen season   Anxiety    reports dx from previous MD   Arthritis    Asthma    After i caught covid-19 in in 2023   GERD (gastroesophageal reflux disease)    Hypertension    Osteoarthritis    Other emphysema  (HCC) 12/02/2023    Past Surgical History:  Procedure Laterality Date   APPENDECTOMY     CESAREAN SECTION     FOOT SURGERY  2021    Current Outpatient Medications on File Prior to Visit  Medication Sig Dispense Refill   albuterol (VENTOLIN HFA) 108 (90 Base) MCG/ACT inhaler Inhale 2 puffs into the lungs every 6 (six) hours as needed for wheezing or shortness of breath. 8 g 2   acetaminophen (TYLENOL) 325 MG tablet Take 650 mg by mouth every 6 (six) hours as needed.     Multiple Vitamin (MULTIVITAMIN PO) Take by mouth.     tiotropium (SPIRIVA HANDIHALER) 18 MCG inhalation capsule Place 1 capsule (18 mcg total) into inhaler and inhale daily. (Patient not taking: Reported on 02/10/2024) 30 capsule 2   Vitamin D, Ergocalciferol, (DRISDOL) 1.25 MG (50000 UNIT) CAPS capsule Take 1 capsule (50,000 Units total) by mouth every 7 (seven) days. 12 capsule 0   No current facility-administered medications on file prior to visit.    Allergies  Allergen Reactions   Bee Pollen    Dust Mite Extract     Social History:  reports that she has quit smoking. Her smoking use included cigarettes. She has been exposed to tobacco smoke. She has never used smokeless tobacco. She reports current alcohol use; around one drink every  few months. She reports that she does not use drugs.  Family History  Problem Relation Age of Onset   Anxiety disorder Mother    Diabetes Mother    Obesity Mother    Stroke Mother    ADD / ADHD Daughter    Cancer Maternal Aunt    Diabetes Maternal Grandfather     The following portions of the patient's history were reviewed and updated as appropriate: allergies, current medications, past family history, past medical history, past social history, past surgical history and problem list.  Review of Systems Pertinent items noted in HPI and remainder of comprehensive ROS otherwise negative.  Physical Exam:  BP 113/77   Pulse (!) 108   Ht 5\' 6"  (1.676 m)   Wt 233 lb (105.7  kg)   LMP 01/13/2024   BMI 37.61 kg/m  CONSTITUTIONAL: Well-developed, well-nourished female in no acute distress.  HENT:  Normocephalic, atraumatic, External right and left ear normal.  EYES: Conjunctivae and EOM are normal. Pupils are equal, round, and reactive to light. No scleral icterus.  NECK: Normal range of motion, supple, no masses.  Normal thyroid.  SKIN: Skin is warm and dry. No rash noted. Not diaphoretic. No erythema. No pallor. MUSCULOSKELETAL: Normal range of motion. No tenderness.  No cyanosis, clubbing, or edema. NEUROLOGIC: Alert and oriented to person, place, and time. Normal reflexes, muscle tone coordination.  PSYCHIATRIC: Normal mood and affect. Normal behavior. Normal judgment and thought content. CARDIOVASCULAR: Normal heart rate noted, regular rhythm RESPIRATORY: Clear to auscultation bilaterally. Effort and breath sounds normal, no problems with respiration noted. BREASTS: Symmetric in size. No masses, tenderness, skin changes, nipple drainage, or lymphadenopathy bilaterally. Performed in the presence of a chaperone. ABDOMEN: Soft, no distention noted.  No tenderness, rebound or guarding.  PELVIC: Normal appearing external genitalia and urethral meatus; normal appearing vaginal mucosa and cervix.  No abnormal vaginal discharge noted.  Pap smear obtained by medical student.  Normal uterine size, no other palpable masses, no uterine or adnexal tenderness.  Performed in the presence of attending physician and a chaperone.   Assessment and Plan:    1. Vaginal pain Pain is improving slowly without much intervention. She was advised to use water based lubricants if her dryness and pain persist. She will follow up with [place] if this does not improve to discuss additional management options. - Cervicovaginal ancillary only( [place])  2. Recent unintentional weight loss over several months The patient has reported around a 30lb weight loss since late January/early February.  She states she has not had any lifestyle modifications to help her lose weight but that ever since she had food poisoning she has had a hard time eating full meals despite having a normal appetite. She also has a family history significant for breast cancer in multiple aunts and cousins. Given early satiety in the setting of unintentional weight loss we will order pelvic US to rule out any adnexal masses. We will follow up with her with her results. She was also recommended to follow up with her PCP to complete unintentional weight loss workup alongside her other specialists. - US PELVIC COMPLETE WITH TRANSVAGINAL; Future Filed Weights   02/10/24 0816  Weight: 233 lb (105.7 kg)    3. Breast cancer screening by mammogram Given history of family breast cancer and that patient is approaching 38 we will proceed with screening mammogram. We will follow up with her pending results. Also discussed genetic testing, she declined at this points. - MM 3D  SCREENING MAMMOGRAM BILATERAL BREAST; Future  4. Well woman exam with routine gynecological exam (Primary) - Cytology - PAP( [place])  Will follow up results of pap smear and manage accordingly. Normal breast examination today, she was advised to perform periodic self breast examinations.  Mammogram scheduled for breast cancer screening. Routine preventative health maintenance measures emphasized. Please refer to After Visit Summary for other counseling recommendations.     Donalda Fruit Adams Medical Student   Attestation of Attending Supervision of Student:  I confirm that I have verified the information documented in the medical student's note and that I have also personally reperformed the history, physical exam and all medical decision making activities.  I have verified that all services and findings are accurately documented in this student's note; and I agree with management and plan as outlined in the documentation. I have also made any  necessary editorial changes.   Lenoard Rad, MD, FACOG Attending Obstetrician & Gynecologist, [facility] for [employer], [facility]

## 2024-02-10 NOTE — Progress Notes (Signed)
 Pt states she does have some vaginal "discomfort" and pain with intercourse. Pt does have family hx of breast CA.  Pt has Nexplanon in place, recently replaced.  Pt states her last cycle was longer than normal.

## 2024-02-12 LAB — CERVICOVAGINAL ANCILLARY ONLY
Bacterial Vaginitis (gardnerella): POSITIVE — AB
Candida Glabrata: NEGATIVE
Candida Vaginitis: NEGATIVE
Chlamydia: NEGATIVE
Comment: NEGATIVE
Comment: NEGATIVE
Comment: NEGATIVE
Comment: NEGATIVE
Comment: NEGATIVE
Comment: NORMAL
Neisseria Gonorrhea: NEGATIVE
Trichomonas: NEGATIVE

## 2024-02-13 ENCOUNTER — Encounter: Payer: Self-pay | Admitting: Obstetrics & Gynecology

## 2024-02-13 LAB — CYTOLOGY - PAP
Comment: NEGATIVE
Diagnosis: NEGATIVE
High risk HPV: NEGATIVE

## 2024-02-13 MED ORDER — METRONIDAZOLE 500 MG PO TABS: 500.0000 mg | ORAL_TABLET | Freq: Two times a day (BID) | ORAL | 0 refills | Status: DC

## 2024-02-13 NOTE — Addendum Note (Signed)
 Addended by: Lenoard Rad A on: 02/13/2024 07:55 AM   Modules accepted: Orders

## 2024-02-17 ENCOUNTER — Ambulatory Visit (HOSPITAL_BASED_OUTPATIENT_CLINIC_OR_DEPARTMENT_OTHER)
Admission: RE | Admit: 2024-02-17 | Discharge: 2024-02-17 | Disposition: A | Discharge status: Home / Self Care | Source: Ambulatory Visit | Attending: Obstetrics & Gynecology | Admitting: Obstetrics & Gynecology

## 2024-02-17 DIAGNOSIS — R634 Abnormal weight loss: Secondary | ICD-10-CM

## 2024-02-18 ENCOUNTER — Ambulatory Visit: Attending: Nurse Practitioner | Admitting: Nurse Practitioner

## 2024-02-18 ENCOUNTER — Encounter: Payer: Self-pay | Admitting: Nurse Practitioner

## 2024-02-18 ENCOUNTER — Ambulatory Visit: Payer: Self-pay

## 2024-02-18 VITALS — BP 112/75 | HR 105 | Resp 20 | Ht 66.0 in | Wt 230.0 lb

## 2024-02-18 DIAGNOSIS — D860 Sarcoidosis of lung: Secondary | ICD-10-CM | POA: Diagnosis not present

## 2024-02-18 MED ORDER — SPIRIVA RESPIMAT 2.5 MCG/ACT IN AERS: 2.0000 | INHALATION_SPRAY | Freq: Every day | RESPIRATORY_TRACT | 1 refills | Status: DC

## 2024-02-18 MED ORDER — PREDNISONE 10 MG PO TABS: ORAL_TABLET | ORAL | 0 refills | Status: DC

## 2024-02-18 MED ORDER — MONTELUKAST SODIUM 10 MG PO TABS: 10.0000 mg | ORAL_TABLET | Freq: Every day | ORAL | 3 refills | Status: DC

## 2024-02-18 NOTE — Telephone Encounter (Signed)
  Chief Complaint: shortness of breath Symptoms: shortness of breath, wheezing, coughing Frequency: started a couple of weeks ago-patient endorses increases in episodes Pertinent Negatives: Patient denies CP Disposition: [] ED /[] Urgent Care (no appt availability in office) / [x] Appointment(In office/virtual)/ []  [place] [facility]/ [] Home Care/ [] Refused Recommended Disposition /[] [facility]/ []  Follow-up with PCP Additional Notes: patient calling with complaints of shortness of breath, wheezing and coughing. Patient reports episodes have been happening more in the last couple of weeks. Patient states she has been using her inhaler but had little improvement. Patient is requesting to be seen in office. Per protocol, patient is recommended to be seen today. Acute appointment made with another provider in PCP office for today at 11:10 AM. Patient verbalized understanding and all questions answered.    Copied from CRM [id number]. Topic: Clinical - Red Word Triage >> Feb 18, 2024  9:41 AM Brianna Jordan wrote: Red Word that prompted transfer to Nurse Triage: Patient having hard time breathing due to emphysema Reason for Disposition  [1] Longstanding difficulty breathing (e.g., CHF, COPD, emphysema) AND [2] WORSE than normal  Answer Assessment - Initial Assessment Questions 1. RESPIRATORY STATUS: "Describe your breathing?" (e.g., wheezing, shortness of breath, unable to speak, severe coughing)      Shortness of breath, coughing, wheezing 2. ONSET: "When did this breathing problem begin?"      Been going on for a couple of weeks 3. PATTERN "Does the difficult breathing come and go, or has it been constant since it started?"      constant 4. SEVERITY: "How bad is your breathing?" (e.g., mild, moderate, severe)    - MILD: No SOB at rest, mild SOB with walking, speaks normally in sentences, can lie down, no retractions, pulse < 100.    - MODERATE: SOB at rest, SOB with minimal exertion and  prefers to sit, cannot lie down flat, speaks in phrases, mild retractions, audible wheezing, pulse 100-120.    - SEVERE: Very SOB at rest, speaks in single words, struggling to breathe, sitting hunched forward, retractions, pulse > 120      Mild to moderate 5. RECURRENT SYMPTOM: "Have you had difficulty breathing before?" If Yes, ask: "When was the last time?" and "What happened that time?"      yes 6. CARDIAC HISTORY: "Do you have any history of heart disease?" (e.g., heart attack, angina, bypass surgery, angioplasty)      no 7. LUNG HISTORY: "Do you have any history of lung disease?"  (e.g., pulmonary embolus, asthma, emphysema)     Emphysema/COPD 8. CAUSE: "What do you think is causing the breathing problem?"      Possible flare 9. OTHER SYMPTOMS: "Do you have any other symptoms? (e.g., dizziness, runny nose, cough, chest pain, fever)     Runny nose, cough 10. O2 SATURATION MONITOR:  "Do you use an oxygen saturation monitor (pulse oximeter) at home?" If Yes, ask: "What is your reading (oxygen level) today?" "What is your usual oxygen saturation reading?" (e.g., 95%)       Patient doesn't have the ability to check 11. PREGNANCY: "Is there any chance you are pregnant?" "When was your last menstrual period?"       no 12. TRAVEL: "Have you traveled out of the country in the last month?" (e.g., travel history, exposures)       no  Protocols used: Breathing Difficulty-A-AH

## 2024-02-18 NOTE — Progress Notes (Signed)
 Assessment & Plan:  Brianna Jordan was seen today for shortness of breath.  Diagnoses and all orders for this visit:  Sarcoidosis, lung (HCC) -     predniSONE  (DELTASONE ) 10 MG tablet; Take 2 tablets once daily in the am for 7 days then decrease to 1 tablet daily for 7 days. -     Tiotropium Bromide  Monohydrate (SPIRIVA  RESPIMAT) 2.5 MCG/ACT AERS; Inhale 2 puffs into the lungs daily. -     montelukast  (SINGULAIR ) 10 MG tablet; Take 1 tablet (10 mg total) by mouth at bedtime.    Patient has been counseled on age-appropriate routine health concerns for screening and prevention. These are reviewed and up-to-date. Referrals have been placed accordingly. Immunizations are up-to-date or declined.    Subjective:   Chief Complaint  Patient presents with   Shortness of Breath    Brianna Jordan 38 y.o. female presents to office today for shortness of breath  She is a patient of Dr. Newlin who was recently diagnosed with possible sarcoidosis. She is scheduled to see pulmonology in 2-3 weeks.   Over the past several days she has been experiencing shortness of breath, coughing (both dry and productive), wheezing and seeing black dots when she increases her activity. She denies any URI symptoms of runny nose, sinus congestion, sore throat, fever.  BP Readings from Last 3 Encounters:  02/18/24 112/75  02/10/24 113/77  01/07/24 106/72     Review of Systems  Constitutional:  Negative for fever, malaise/fatigue and weight loss.  HENT: Negative.  Negative for nosebleeds.   Eyes: Negative.  Negative for blurred vision, double vision and photophobia.  Respiratory:  Positive for cough, sputum production, shortness of breath and wheezing.   Cardiovascular: Negative.  Negative for chest pain, palpitations and leg swelling.  Gastrointestinal: Negative.  Negative for heartburn, nausea and vomiting.  Musculoskeletal: Negative.  Negative for myalgias.  Neurological: Negative.  Negative for dizziness, focal  weakness, seizures and headaches.  Psychiatric/Behavioral: Negative.  Negative for suicidal ideas.     Past Medical History:  Diagnosis Date   Allergy    When its pollen season   Anxiety    reports dx from previous MD   Arthritis    Asthma    After i caught covid-19 in in 2023   GERD (gastroesophageal reflux disease)    Hypertension    Osteoarthritis    Other emphysema (HCC) 12/02/2023    Past Surgical History:  Procedure Laterality Date   APPENDECTOMY     CESAREAN SECTION     FOOT SURGERY  2021    Family History  Problem Relation Age of Onset   Anxiety disorder Mother    Diabetes Mother    Obesity Mother    Stroke Mother    ADD / ADHD Daughter    Cancer Maternal Aunt    Diabetes Maternal Grandfather     Social History Reviewed with no changes to be made today.   Outpatient Medications Prior to Visit  Medication Sig Dispense Refill   acetaminophen  (TYLENOL ) 325 MG tablet Take 650 mg by mouth every 6 (six) hours as needed.     albuterol  (VENTOLIN  HFA) 108 (90 Base) MCG/ACT inhaler Inhale 2 puffs into the lungs every 6 (six) hours as needed for wheezing or shortness of breath. 8 g 2   metroNIDAZOLE  (FLAGYL ) 500 MG tablet Take 1 tablet (500 mg total) by mouth 2 (two) times daily for 7 days. 14 tablet 0   Multiple Vitamin (MULTIVITAMIN PO) Take by mouth.  Vitamin D , Ergocalciferol , (DRISDOL ) 1.25 MG (50000 UNIT) CAPS capsule Take 1 capsule (50,000 Units total) by mouth every 7 (seven) days. 12 capsule 0   tiotropium (SPIRIVA  HANDIHALER) 18 MCG inhalation capsule Place 1 capsule (18 mcg total) into inhaler and inhale daily. (Patient not taking: Reported on 02/18/2024) 30 capsule 2   No facility-administered medications prior to visit.    Allergies  Allergen Reactions   Bee Pollen    Dust Mite Extract        Objective:    BP 112/75 (BP Location: Left Arm, Patient Position: Sitting, Cuff Size: Large)   Pulse (!) 105   Resp 20   Ht 5\' 6"  (1.676 m)   Wt 230 lb  (104.3 kg)   LMP 01/13/2024   SpO2 90%   BMI 37.12 kg/m  Wt Readings from Last 3 Encounters:  02/18/24 230 lb (104.3 kg)  02/10/24 233 lb (105.7 kg)  01/07/24 232 lb (105.2 kg)    Physical Exam Vitals and nursing note reviewed.  Constitutional:      Appearance: She is well-developed.  HENT:     Head: Normocephalic and atraumatic.  Cardiovascular:     Rate and Rhythm: Normal rate and regular rhythm.     Heart sounds: Normal heart sounds. No murmur heard.    No friction rub. No gallop.  Pulmonary:     Effort: Pulmonary effort is normal. No tachypnea or respiratory distress.     Breath sounds: Normal breath sounds. No decreased breath sounds, wheezing, rhonchi or rales.  Chest:     Chest wall: No tenderness.  Abdominal:     General: Bowel sounds are normal.     Palpations: Abdomen is soft.  Musculoskeletal:        General: Normal range of motion.     Cervical back: Normal range of motion.  Skin:    General: Skin is warm and dry.  Neurological:     Mental Status: She is alert and oriented to person, place, and time.     Coordination: Coordination normal.  Psychiatric:        Behavior: Behavior normal. Behavior is cooperative.        Thought Content: Thought content normal.        Judgment: Judgment normal.          Patient has been counseled extensively about nutrition and exercise as well as the importance of adherence with medications and regular follow-up. The patient was given clear instructions to go to ER or return to medical center if symptoms don't improve, worsen or new problems develop. The patient verbalized understanding.   Follow-up: No follow-ups on file.   Collins Dean, FNP-BC [facility] [place], [place] [phone]   02/18/2024, 12:13 PM

## 2024-02-21 ENCOUNTER — Telehealth: Payer: Self-pay

## 2024-02-21 NOTE — Telephone Encounter (Signed)
 Patient was called and informed that form is ready for pick up.

## 2024-02-27 NOTE — Progress Notes (Deleted)
 Office Visit Note  Patient: Brianna Jordan             Date of Birth: [birth year]           MRN: [id number]             PCP: Joaquin Mulberry, MD Referring: Joaquin Mulberry, MD Visit Date: 03/09/2024   Subjective:  No chief complaint on file.   History of Present Illness: Brianna Jordan is a 38 y.o. female here for follow up ***   Previous HPI 12/31/23 Brianna Jordan is a 38 year old female here for evaluation of positive ANA checked associated with joint pain in multiple areas.    She experiences persistent, generalized joint pain, describing it as akin to 'being hit by a bus'. Her hands cramp, making tasks like doing her hair difficult, and her fingers sometimes get stuck, requiring massage for relief. Her knees have been problematic since early 2023 when her right knee popped and swelled, improving after four months of physical therapy. Although the swelling was a more isolated event, she now experiences knee pain with prolonged standing and during rainy weather. Her feet feel as though they have been 'ran over twice'. She has been taking Tylenol , which is no longer effective, and discontinued meloxicam  due to bleeding concerns.   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 She experiences skin rashes, particularly on her hands and face, and uses moisturizers and a humidifier to manage symptoms.   She reports intermittent numbness, with her arm going to sleep if she lies on one side too long, and frequent cramping in her foot. She experiences constipation and diarrhea, which she manages by eating vegetables.   She had COVID-19 in 2023, initially presenting with mild symptoms but later leading to significant breathing difficulties. She was diagnosed with emphysema, possibly related to her past smoking habit, which she quit several years ago. 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   She had lymph node swelling as indicated by her CT scan, but she does not regularly noticed nodules in her face and neck but doesn't check for it.   She experiences weakness on her right side and ankle swelling, with a sensation of a bone shifting in her foot, which has a history of surgery.   She has lost weight unintentionally, about 13 lbs less today compared to last PCP appointment, which she thinks has helped alleviate some joint symptoms. She is attempting to maintain a healthy diet rich in vegetables to manage gastrointestinal symptoms.    Labs reviewed 06/2023 ANA 1:80 speckled dsDNA, Sm neg RF neg CCP neg ESR wnl CRP 13   Imaging reviewed 11/19/23 HRCT Chest IMPRESSION: 1. Mildly coarsened peribronchovascular ill-defined nodularity and ground-glass in a craniocaudal gradient with nodularity along the fissures. Findings may be due to sarcoid, especially given small to borderline enlarged mediastinal lymph nodes. 2.  Emphysema (ICD10-J43.9).   No Rheumatology ROS completed.   PMFS History:  Patient Active Problem List   Diagnosis Date Noted   Positive ANA (antinuclear antibody) 12/31/2023   Polyarthralgia 12/31/2023   Vitamin D  deficiency 12/31/2023   Rash and other nonspecific skin eruption 12/31/2023   Other emphysema (HCC) 12/02/2023   Nexplanon  in place 09/06/2020   BMI 40.0-44.9, adult (HCC) 09/06/2020    Past Medical History:  Diagnosis Date   Allergy    When its pollen season   Anxiety  reports dx from previous MD   Arthritis    Asthma    After i caught covid-19 in in 2023   GERD (gastroesophageal reflux disease)    Hypertension    Osteoarthritis    Other emphysema (HCC) 12/02/2023    Family History  Problem Relation Age of Onset   Anxiety disorder Mother    Diabetes Mother    Obesity Mother    Stroke Mother    ADD / ADHD Daughter    Cancer Maternal Aunt    Diabetes Maternal Grandfather    Past Surgical History:   Procedure Laterality Date   APPENDECTOMY     CESAREAN SECTION     FOOT SURGERY  2021   Social History   Social History Narrative   Not on file   Immunization History  Administered Date(s) Administered   Influenza, Seasonal, Injecte, Preservative Fre 07/18/2023   PFIZER(Purple Top)SARS-COV-2 Vaccination 01/18/2020, 02/08/2020     Objective: Vital Signs: LMP 01/13/2024    Physical Exam   Musculoskeletal Exam: ***  CDAI Exam: CDAI Score: -- Patient Global: --; Provider Global: -- Swollen: --; Tender: -- Joint Exam 03/09/2024   No joint exam has been documented for this visit   There is currently no information documented on the homunculus. Go to the Rheumatology activity and complete the homunculus joint exam.  Investigation: No additional findings.  Imaging: No results found.  Recent Labs: Lab Results  Component Value Date   WBC 6.1 09/06/2020   HGB 11.8 09/06/2020   PLT 274 09/06/2020   NA 137 07/18/2023   K 4.4 07/18/2023   CL 102 07/18/2023   CO2 21 07/18/2023   GLUCOSE 76 07/18/2023   BUN 9 07/18/2023   CREATININE 0.84 07/18/2023   BILITOT 0.3 07/18/2023   ALKPHOS 53 07/18/2023   AST 21 07/18/2023   ALT 12 07/18/2023   PROT 8.0 07/18/2023   ALBUMIN 4.0 07/18/2023   CALCIUM 9.4 07/18/2023   GFRAA 94 09/06/2020    Speciality Comments: No specialty comments available.  Procedures:  No procedures performed Allergies: Bee pollen and Dust mite extract   Assessment / Plan:     Visit Diagnoses: No diagnosis found.  ***  Orders: No orders of the defined types were placed in this encounter.  No orders of the defined types were placed in this encounter.    Follow-Up Instructions: No follow-ups on file.   Glena Landau, RT  Note - This record has been created using [employer].  Chart creation errors have been sought, but may not always  have been located. Such creation errors do not reflect on  the standard of medical care.

## 2024-03-02 ENCOUNTER — Encounter (HOSPITAL_BASED_OUTPATIENT_CLINIC_OR_DEPARTMENT_OTHER): Payer: Self-pay

## 2024-03-02 ENCOUNTER — Ambulatory Visit (HOSPITAL_BASED_OUTPATIENT_CLINIC_OR_DEPARTMENT_OTHER)

## 2024-03-09 ENCOUNTER — Ambulatory Visit: Admitting: Internal Medicine

## 2024-03-11 ENCOUNTER — Institutional Professional Consult (permissible substitution): Admitting: Pulmonary Disease

## 2024-03-12 ENCOUNTER — Encounter: Payer: Self-pay | Admitting: Pulmonary Disease

## 2024-03-12 ENCOUNTER — Ambulatory Visit (HOSPITAL_BASED_OUTPATIENT_CLINIC_OR_DEPARTMENT_OTHER)

## 2024-03-12 ENCOUNTER — Ambulatory Visit (INDEPENDENT_AMBULATORY_CARE_PROVIDER_SITE_OTHER): Admitting: Pulmonary Disease

## 2024-03-12 VITALS — BP 108/64 | HR 112 | Ht 66.0 in | Wt 227.0 lb

## 2024-03-12 DIAGNOSIS — M2550 Pain in unspecified joint: Secondary | ICD-10-CM

## 2024-03-12 DIAGNOSIS — M791 Myalgia, unspecified site: Secondary | ICD-10-CM | POA: Diagnosis not present

## 2024-03-12 DIAGNOSIS — J849 Interstitial pulmonary disease, unspecified: Secondary | ICD-10-CM | POA: Diagnosis not present

## 2024-03-12 DIAGNOSIS — R590 Localized enlarged lymph nodes: Secondary | ICD-10-CM

## 2024-03-12 DIAGNOSIS — R0683 Snoring: Secondary | ICD-10-CM

## 2024-03-12 LAB — CBC WITH DIFFERENTIAL/PLATELET
Basophils Absolute: 0.1 10*3/uL (ref 0.0–0.1)
Basophils Relative: 0.6 % (ref 0.0–3.0)
Eosinophils Absolute: 0.5 10*3/uL (ref 0.0–0.7)
Eosinophils Relative: 5.3 % — ABNORMAL HIGH (ref 0.0–5.0)
HCT: 36.2 % (ref 36.0–46.0)
Hemoglobin: 11.9 g/dL — ABNORMAL LOW (ref 12.0–15.0)
Lymphocytes Relative: 19 % (ref 12.0–46.0)
Lymphs Abs: 1.8 10*3/uL (ref 0.7–4.0)
MCHC: 32.7 g/dL (ref 30.0–36.0)
MCV: 82.2 fl (ref 78.0–100.0)
Monocytes Absolute: 0.8 10*3/uL (ref 0.1–1.0)
Monocytes Relative: 8.1 % (ref 3.0–12.0)
Neutro Abs: 6.5 10*3/uL (ref 1.4–7.7)
Neutrophils Relative %: 67 % (ref 43.0–77.0)
Platelets: 283 10*3/uL (ref 150.0–400.0)
RBC: 4.41 Mil/uL (ref 3.87–5.11)
RDW: 15.1 % (ref 11.5–15.5)
WBC: 9.7 10*3/uL (ref 4.0–10.5)

## 2024-03-12 LAB — COMPREHENSIVE METABOLIC PANEL WITH GFR
ALT: 11 U/L (ref 0–35)
AST: 16 U/L (ref 0–37)
Albumin: 3.7 g/dL (ref 3.5–5.2)
Alkaline Phosphatase: 55 U/L (ref 39–117)
BUN: 5 mg/dL — ABNORMAL LOW (ref 6–23)
CO2: 22 meq/L (ref 19–32)
Calcium: 9.3 mg/dL (ref 8.4–10.5)
Chloride: 106 meq/L (ref 96–112)
Creatinine, Ser: 0.69 mg/dL (ref 0.40–1.20)
GFR: 109.16 mL/min (ref >60.00)
Glucose, Bld: 106 mg/dL — ABNORMAL HIGH (ref 70–99)
Potassium: 3.5 meq/L (ref 3.5–5.1)
Sodium: 136 meq/L (ref 135–145)
Total Bilirubin: 0.4 mg/dL (ref 0.2–1.2)
Total Protein: 9 g/dL — ABNORMAL HIGH (ref 6.0–8.3)

## 2024-03-12 LAB — CK: Total CK: 113 U/L (ref 7–177)

## 2024-03-12 LAB — SEDIMENTATION RATE: Sed Rate: 117 mm/h — ABNORMAL HIGH (ref 0–20)

## 2024-03-12 LAB — C-REACTIVE PROTEIN: CRP: 6 mg/dL (ref 0.5–20.0)

## 2024-03-12 MED ORDER — FLUTICASONE-SALMETEROL 250-50 MCG/ACT IN AEPB: 1.0000 | INHALATION_SPRAY | Freq: Two times a day (BID) | RESPIRATORY_TRACT | 5 refills | Status: DC

## 2024-03-12 NOTE — Progress Notes (Signed)
 Synopsis: Referred in May 2025 for Sarcoidosis  Subjective:   PATIENT ID: Brianna Jordan GENDER: female DOB: Jul 10, 1986, MRN: [id number]   HPI  Chief Complaint  Patient presents with   Consult    Pt states    Brianna Jordan is a 38 year old female, former smoker with history of GERD, hypertension and asthma who is referred to pulmonary clinic for concern of sarcoidosis.  She was seen by Dr. Rodell Citrin of rheumatology on 12/31/23, note reviewed.  She experiences persistent coughing and shortness of breath, worsened by physical activity and alleviated when seated. She experiences choking during sleep and possible snoring. Daytime sleepiness and frequent coughing fits with mucus production are present. Cold weather exacerbates her symptoms. She denies post-nasal drainage. She reports improvement in her prednisone  and spiriva  with improvement in her symptoms while on prednisone . She has diffuse muscle and joint aches.  She had covid 19 infection December 2023. 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 She has a history of smoking, sharing a pack every other day from age 38 until quitting in 2019.   HRCT Chest 10/2023 showed peribronchovascular nodularity and ground glass infiltrates with craniocaudal gradient. Subpleural emphysema vs cyst formation in areas of ground glass attenuation. Mediastinal adenopathy present at station 7.  FH: Cousin on mother's side, has MS  Past Medical History:  Diagnosis Date   Allergy    When its pollen season   Anxiety    reports dx from previous MD   Arthritis    Asthma    After i caught covid-19 in in 2023   GERD (gastroesophageal reflux disease)    Hypertension    Osteoarthritis    Other emphysema (HCC) 12/02/2023     Family History  Problem Relation Age of Onset   Anxiety disorder Mother    Diabetes Mother    Obesity Mother    Stroke Mother    ADD / ADHD Daughter    Cancer Maternal Aunt    Diabetes Maternal Grandfather       Social History   Socioeconomic History   Marital status: Married    Spouse name: Not on file   Number of children: 2   Years of education: Not on file   Highest education level: 12th grade  Occupational History   Not on file  Tobacco Use   Smoking status: Former    Types: Cigarettes    Passive exposure: Past   Smokeless tobacco: Never  Vaping Use   Vaping status: Never Used  Substance and Sexual Activity   Alcohol use: Yes    Comment: rarely drink wine   Drug use: No   Sexual activity: Yes    Birth control/protection: Implant  Other Topics Concern   Not on file  Social History Narrative   Not on file   Social Drivers of Health   Financial Resource Strain: Medium Risk (07/17/2023)   Overall Financial Resource Strain (CARDIA)    Difficulty of Paying Living Expenses: Somewhat hard  Food Insecurity: Food Insecurity Present (07/17/2023)   Hunger Vital Sign    Worried About Running Out of Food in the Last Year: Sometimes true    Ran Out of Food in the Last Year: Sometimes true  Transportation Needs: No Transportation Needs (07/17/2023)   PRAPARE - Administrator, Civil Service (Medical): No    Lack of Transportation (Non-Medical): No  Physical Activity: Insufficiently Active (07/17/2023)   Exercise Vital Sign    Days of Exercise per  Week: 6 days    Minutes of Exercise per Session: 20 min  Stress: No Stress Concern Present (07/17/2023)   [employer] of Occupational Health - Occupational Stress Questionnaire    Feeling of Stress : Only a little  Social Connections: Moderately Isolated (07/17/2023)   Social Connection and Isolation Panel [NHANES]    Frequency of Communication with Friends and Family: Three times a week    Frequency of Social Gatherings with Friends and Family: Once a week    Attends Religious Services: Never    Database administrator or Organizations: No    Attends Engineer, structural: Not on file    Marital Status: Married   Catering manager Violence: Not on file     Allergies  Allergen Reactions   Bee Pollen    Dust Mite Extract      Outpatient Medications Prior to Visit  Medication Sig Dispense Refill   acetaminophen  (TYLENOL ) 325 MG tablet Take 650 mg by mouth every 6 (six) hours as needed.     albuterol  (VENTOLIN  HFA) 108 (90 Base) MCG/ACT inhaler Inhale 2 puffs into the lungs every 6 (six) hours as needed for wheezing or shortness of breath. 8 g 2   montelukast  (SINGULAIR ) 10 MG tablet Take 1 tablet (10 mg total) by mouth at bedtime. 30 tablet 3   Multiple Vitamin (MULTIVITAMIN PO) Take by mouth.     Tiotropium Bromide  Monohydrate (SPIRIVA  RESPIMAT) 2.5 MCG/ACT AERS Inhale 2 puffs into the lungs daily. 4 g 1   Vitamin D , Ergocalciferol , (DRISDOL ) 1.25 MG (50000 UNIT) CAPS capsule Take 1 capsule (50,000 Units total) by mouth every 7 (seven) days. 12 capsule 0   predniSONE  (DELTASONE ) 10 MG tablet Take 2 tablets once daily in the am for 7 days then decrease to 1 tablet daily for 7 days. (Patient not taking: Reported on 03/12/2024) 21 tablet 0   No facility-administered medications prior to visit.    Review of Systems  Constitutional:  Negative for chills, fever, malaise/fatigue and weight loss.  HENT:  Negative for congestion, sinus pain and sore throat.   Eyes: Negative.   Respiratory:  Positive for cough, sputum production and shortness of breath. Negative for hemoptysis and wheezing.   Cardiovascular:  Negative for chest pain, palpitations, orthopnea, claudication and leg swelling.  Gastrointestinal:  Negative for abdominal pain, heartburn, nausea and vomiting.  Genitourinary: Negative.   Musculoskeletal:  Positive for back pain, joint pain and myalgias.  Skin:  Negative for rash.  Neurological:  Positive for headaches. Negative for weakness.  Endo/Heme/Allergies: Negative.   Psychiatric/Behavioral: Negative.        Objective:   Vitals:   03/12/24 0920  BP: 108/64  Pulse: (!) 112   SpO2: 90%  Weight: 227 lb (103 kg)  Height: 5\' 6"  (1.676 m)     Physical Exam Constitutional:      General: She is not in acute distress.    Appearance: Normal appearance.  Eyes:     General: No scleral icterus.    Conjunctiva/sclera: Conjunctivae normal.  Cardiovascular:     Rate and Rhythm: Normal rate and regular rhythm.  Pulmonary:     Breath sounds: Decreased air movement present. No wheezing, rhonchi or rales.  Musculoskeletal:     Right lower leg: No edema.     Left lower leg: No edema.  Skin:    General: Skin is warm and dry.  Neurological:     General: No focal deficit present.    CBC  Component Value Date/Time   WBC 6.1 09/06/2020 0942   WBC 7.4 06/25/2014 1858   RBC 4.16 09/06/2020 0942   RBC 4.31 06/25/2014 1858   HGB 11.8 09/06/2020 0942   HCT 36.2 09/06/2020 0942   PLT 274 09/06/2020 0942   MCV 87 09/06/2020 0942   MCH 28.4 09/06/2020 0942   MCH 29.0 06/25/2014 1858   MCHC 32.6 09/06/2020 0942   MCHC 33.8 06/25/2014 1858   RDW 12.8 09/06/2020 0942   LYMPHSABS 2.3 07/10/2018 1010   MONOABS 0.6 06/25/2014 1858   EOSABS 0.1 07/10/2018 1010   BASOSABS 0.0 07/10/2018 1010      Latest Ref Rng & Units 07/18/2023   10:49 AM 09/06/2020    9:42 AM 07/10/2018   10:10 AM  BMP  Glucose 70 - 99 mg/dL 76  161  89   BUN 6 - 20 mg/dL 9  11  11    Creatinine 0.57 - 1.00 mg/dL 0.96  0.45  4.09   BUN/Creat Ratio 9 - 23 11  12  13    Sodium 134 - 144 mmol/L 137  137  140   Potassium 3.5 - 5.2 mmol/L 4.4  4.0  4.2   Chloride 96 - 106 mmol/L 102  102  103   CO2 20 - 29 mmol/L 21  22  22    Calcium 8.7 - 10.2 mg/dL 9.4  9.4  9.9    Chest imaging: HRCT Chest 11/19/23 Mediastinum/Nodes: Small mediastinal lymph nodes. Subcarinal lymph node measures approximately 2.0 cm. Hilar regions are difficult to definitively evaluate without IV contrast. No axillary adenopathy. Hilar regions are difficult to definitively evaluate without IV contrast. Esophagus is grossly  unremarkable.   Lungs/Pleura: Peribronchovascular ill-defined nodularity and ground-glass in a craniocaudal gradient. Mild associated pulmonary parenchymal coarsening. Nodularity along the fissures. Mild centrilobular and paraseptal emphysema. No pleural fluid. Airway is unremarkable. No air trapping.  PFT:     No data to display          Labs: ANA 1:80 speckled RNP + ESR 56 ACE negative SSA/SSB negative C3/C4 negative  Path:  Echo:  Heart Catheterization:    Assessment & Plan:   Arthralgia, unspecified joint  Myalgia  Mediastinal adenopathy  ILD (interstitial lung disease) (HCC) - Plan: CT CHEST HIGH RESOLUTION, ANA, ANCA screen with reflex titer, Anti-Smith antibody, Rheumatoid factor, Hypersensitivity Pneumonitis, Cyclic citrul peptide antibody, IgG, Sedimentation rate, Sjogren's syndrome antibods(ssa + ssb), C-reactive protein, MyoMarker 3 Plus Profile (RDL), Aldolase, CK, Pulmonary Function Test, CBC with Differential/Platelet, Comp Met (CMET), fluticasone-salmeterol (ADVAIR) 250-50 MCG/ACT AEPB  Snoring - Plan: Home sleep test  Discussion: Alfreida Pamintuan is a 38 year old female, former smoker with history of GERD, hypertension and asthma who is referred to pulmonary clinic for concern of sarcoidosis.  Concern for Inflammatory Lung Disease Patient has diffuse arthralgia/myalgis, diffuse ground glass attenuation and peribronchovascular nodular findings on HRCT Chest from 10/2023 and mild adenoopathy. Differential includes sarcoidosis vs myositis vs other autoimmune condition.  - check autoimmune panel  - check PFTs - repeat HRCT Chest - continue spiriva  and add advair diskus 250-50mcg 1 puff twice daily - Discussed potential need for bronchoscopy with biopsies vs lung biopsy with CT Surgery based on repeat CT Chest results  Snoring - check home sleep study for concern of sleep apnea - BMI 36  Follow up in 1 month  Duaine German, MD [facility] Office: [phone]    Current Outpatient Medications:    acetaminophen  (TYLENOL ) 325 MG  tablet, Take 650 mg by mouth every 6 (six) hours as needed., Disp: , Rfl:    albuterol  (VENTOLIN  HFA) 108 (90 Base) MCG/ACT inhaler, Inhale 2 puffs into the lungs every 6 (six) hours as needed for wheezing or shortness of breath., Disp: 8 g, Rfl: 2   fluticasone-salmeterol (ADVAIR) 250-50 MCG/ACT AEPB, Inhale 1 puff into the lungs in the morning and at bedtime., Disp: 60 each, Rfl: 5   montelukast  (SINGULAIR ) 10 MG tablet, Take 1 tablet (10 mg total) by mouth at bedtime., Disp: 30 tablet, Rfl: 3   Multiple Vitamin (MULTIVITAMIN PO), Take by mouth., Disp: , Rfl:    Tiotropium Bromide  Monohydrate (SPIRIVA  RESPIMAT) 2.5 MCG/ACT AERS, Inhale 2 puffs into the lungs daily., Disp: 4 g, Rfl: 1   Vitamin D , Ergocalciferol , (DRISDOL ) 1.25 MG (50000 UNIT) CAPS capsule, Take 1 capsule (50,000 Units total) by mouth every 7 (seven) days., Disp: 12 capsule, Rfl: 0   predniSONE  (DELTASONE ) 10 MG tablet, Take 2 tablets once daily in the am for 7 days then decrease to 1 tablet daily for 7 days. (Patient not taking: Reported on 03/12/2024), Disp: 21 tablet, Rfl: 0

## 2024-03-12 NOTE — Patient Instructions (Addendum)
 I am concerned for inflammatory lung disease which can include sarcoidosis  We will schedule you for high resolution CT Chest scan as soon as possible  We will check inflammatory labs today  Continue spiriva  inhaler 2 puffs daily  We will schedule you for home sleep study  Follow up in 1 month to review results

## 2024-03-16 LAB — SJOGREN'S SYNDROME ANTIBODS(SSA + SSB)
SSA (Ro) (ENA) Antibody, IgG: <1 AI
SSB (La) (ENA) Antibody, IgG: <1 AI

## 2024-03-16 LAB — HYPERSENSITIVITY PNEUMONITIS
A. Pullulans Abs: NEGATIVE
A.Fumigatus #1 Abs: NEGATIVE
Micropolyspora faeni, IgG: NEGATIVE
Pigeon Serum Abs: NEGATIVE
Thermoact. Saccharii: NEGATIVE
Thermoactinomyces vulgaris, IgG: NEGATIVE

## 2024-03-16 LAB — RHEUMATOID FACTOR: Rheumatoid fact SerPl-aCnc: <10 [IU]/mL (ref <14)

## 2024-03-16 LAB — ANCA SCREEN W REFLEX TITER: ANCA SCREEN: NEGATIVE

## 2024-03-16 LAB — ANTI-SMITH ANTIBODY: ENA SM Ab Ser-aCnc: <1 AI

## 2024-03-16 LAB — ALDOLASE: Aldolase: 6.6 U/L (ref <=8.1)

## 2024-03-16 LAB — ANA: Anti Nuclear Antibody (ANA): POSITIVE — AB

## 2024-03-16 LAB — ANTI-NUCLEAR AB-TITER (ANA TITER): ANA Titer 1: 1:160 {titer} — ABNORMAL HIGH

## 2024-03-16 LAB — CYCLIC CITRUL PEPTIDE ANTIBODY, IGG: Cyclic Citrullin Peptide Ab: <16 U

## 2024-03-17 ENCOUNTER — Telehealth: Payer: Self-pay | Admitting: *Deleted

## 2024-03-17 NOTE — Telephone Encounter (Signed)
 Copied from CRM [id number]. Topic: General - Other >> Mar 16, 2024 11:47 AM Isabell A wrote: Reason for CRM: Brianna Jordan from [employer] team on behalf of Brianna Jordan states patient has requested to complete her CT CHEST HIGH RESOLUTION (Order [id number]) at [facility] - requesting orders sent to Fax no: [phone]

## 2024-03-18 ENCOUNTER — Telehealth (HOSPITAL_BASED_OUTPATIENT_CLINIC_OR_DEPARTMENT_OTHER): Payer: Self-pay

## 2024-03-18 NOTE — Telephone Encounter (Signed)
 Copied from CRM [id number]. Topic: Clinical - Lab/Test Results >> Mar 17, 2024  2:20 PM Eveleen Hinds B wrote: Reason for CRM:  Second request: Ivonne H from [facility] team, patient has requested to complete her CT CHEST HIGH RESOLUTION (Order [id number]) at [facility] - requesting orders sent to Fax no: [phone]

## 2024-03-18 NOTE — Telephone Encounter (Signed)
 Pt's referral has been sent to [facility] imaging and they will call the patient today. NFN

## 2024-03-20 DIAGNOSIS — J849 Interstitial pulmonary disease, unspecified: Secondary | ICD-10-CM | POA: Diagnosis not present

## 2024-03-24 ENCOUNTER — Ambulatory Visit (HOSPITAL_BASED_OUTPATIENT_CLINIC_OR_DEPARTMENT_OTHER)

## 2024-03-30 ENCOUNTER — Ambulatory Visit: Admitting: Internal Medicine

## 2024-04-08 ENCOUNTER — Encounter: Payer: Self-pay | Admitting: Internal Medicine

## 2024-04-08 ENCOUNTER — Telehealth: Payer: Self-pay

## 2024-04-08 ENCOUNTER — Ambulatory Visit: Attending: Internal Medicine | Admitting: Internal Medicine

## 2024-04-08 VITALS — BP 121/77 | HR 102 | Resp 14 | Ht 66.0 in | Wt 226.0 lb

## 2024-04-08 DIAGNOSIS — M2550 Pain in unspecified joint: Secondary | ICD-10-CM

## 2024-04-08 DIAGNOSIS — E559 Vitamin D deficiency, unspecified: Secondary | ICD-10-CM | POA: Diagnosis not present

## 2024-04-08 DIAGNOSIS — D860 Sarcoidosis of lung: Secondary | ICD-10-CM | POA: Diagnosis not present

## 2024-04-08 DIAGNOSIS — R768 Other specified abnormal immunological findings in serum: Secondary | ICD-10-CM | POA: Diagnosis not present

## 2024-04-08 DIAGNOSIS — M3311 Other dermatopolymyositis with respiratory involvement: Secondary | ICD-10-CM | POA: Insufficient documentation

## 2024-04-08 NOTE — Telephone Encounter (Signed)
 Patient had an appointment today and as she was leaving the office, inquired if she could take Delsym Cough medicine while taking the methotrexate. Asked Dr. Rodell Citrin, and per Dr. Rodell Citrin okay for patient to take Delsym while taking Methotrexate. Attempted to contact the patient an left a message to call the office back.

## 2024-04-08 NOTE — Telephone Encounter (Signed)
 Patient call the office back advised per Dr. Rodell Citrin okay for patient to take Delsym while taking Methotrexate.
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
## 2024-04-12 ENCOUNTER — Other Ambulatory Visit: Payer: Self-pay | Admitting: Internal Medicine

## 2024-04-12 DIAGNOSIS — E559 Vitamin D deficiency, unspecified: Secondary | ICD-10-CM

## 2024-04-13 ENCOUNTER — Other Ambulatory Visit: Payer: Self-pay | Admitting: Nurse Practitioner

## 2024-04-13 DIAGNOSIS — D860 Sarcoidosis of lung: Secondary | ICD-10-CM

## 2024-04-16 ENCOUNTER — Ambulatory Visit (HOSPITAL_COMMUNITY)
Admission: EM | Admit: 2024-04-16 | Discharge: 2024-04-16 | Disposition: A | Discharge status: Home / Self Care | Attending: Emergency Medicine | Admitting: Emergency Medicine

## 2024-04-16 ENCOUNTER — Encounter (HOSPITAL_COMMUNITY): Payer: Self-pay | Admitting: *Deleted

## 2024-04-16 ENCOUNTER — Other Ambulatory Visit: Payer: Self-pay | Admitting: Obstetrics & Gynecology

## 2024-04-16 DIAGNOSIS — M62830 Muscle spasm of back: Secondary | ICD-10-CM

## 2024-04-16 DIAGNOSIS — B9689 Other specified bacterial agents as the cause of diseases classified elsewhere: Secondary | ICD-10-CM

## 2024-04-16 MED ORDER — METHOCARBAMOL 500 MG PO TABS: 500.0000 mg | ORAL_TABLET | Freq: Two times a day (BID) | ORAL | 0 refills | Status: AC

## 2024-04-16 MED ORDER — DEXAMETHASONE SODIUM PHOSPHATE 10 MG/ML IJ SOLN
10.0000 mg | Freq: Once | INTRAMUSCULAR | Status: AC
  Administered 2024-04-16: 10 mg via INTRAMUSCULAR

## 2024-04-16 MED ORDER — DEXAMETHASONE SODIUM PHOSPHATE 10 MG/ML IJ SOLN
INTRAMUSCULAR | Status: AC
  Filled 2024-04-16: qty 1

## 2024-04-16 NOTE — ED Provider Notes (Signed)
 [facility]    CSN: [id number] Arrival date & time: 04/16/24  1655      History   Chief Complaint Chief Complaint  Patient presents with   Back Pain    HPI Brianna Jordan is a 38 y.o. female.   Patient presents to clinic over concern of right sided thoracic back pain for the past 2 days.  Noticed it initially a little yesterday but it got much worse today.  Is having pain with coughing, does have a history of emphysema and she has been coughing routinely, nothing out of her normal.  Denies any wheezing or shortness of breath.  Denies chest pain.  Has not had any fever.  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  Pain is also triggered with changing positions, bending and twisting.  The history is provided by the patient and medical records.  Back Pain   Past Medical History:  Diagnosis Date   Allergy    When its pollen season   Anxiety    reports dx from previous MD   Arthritis    Asthma    After i caught covid-19 in in 2023   GERD (gastroesophageal reflux disease)    Hypertension    Osteoarthritis    Other emphysema (HCC) 12/02/2023    Patient Active Problem List   Diagnosis Date Noted   Sarcoidosis of lung (HCC) 04/08/2024   Positive ANA (antinuclear antibody) 12/31/2023   Polyarthralgia 12/31/2023   Vitamin D  deficiency 12/31/2023   Rash and other nonspecific skin eruption 12/31/2023   Other emphysema (HCC) 12/02/2023   Nexplanon  in place 09/06/2020   BMI 40.0-44.9, adult (HCC) 09/06/2020    Past Surgical History:  Procedure Laterality Date   APPENDECTOMY     CESAREAN SECTION     FOOT SURGERY  2021    OB History     Gravida  2   Para  1   Term  1   Preterm      AB      Living  2      SAB      IAB      Ectopic      Multiple      Live Births  2            Home Medications    Prior to Admission medications   Medication Sig Start Date End Date Taking? Authorizing  Provider  methocarbamol (ROBAXIN) 500 MG tablet Take 1 tablet (500 mg total) by mouth 2 (two) times daily. 04/16/24  Yes Inella Kuwahara  N, FNP  acetaminophen  (TYLENOL ) 325 MG tablet Take 650 mg by mouth every 6 (six) hours as needed.    [provider]  albuterol  (VENTOLIN  HFA) 108 (90 Base) MCG/ACT inhaler Inhale 2 puffs into the lungs every 6 (six) hours as needed for wheezing or shortness of breath. 12/02/23   Newlin, Enobong, MD  fluticasone -salmeterol (ADVAIR) 250-50 MCG/ACT AEPB Inhale 1 puff into the lungs in the morning and at bedtime. 03/12/24   Wilfredo Hanly, MD  montelukast  (SINGULAIR ) 10 MG tablet TAKE 1 TABLET BY MOUTH EVERYDAY AT BEDTIME 04/13/24   Newlin, Enobong, MD  Multiple Vitamin (MULTIVITAMIN PO) Take by mouth. Patient not taking: Reported on 04/08/2024    [provider]  predniSONE  (DELTASONE ) 10 MG tablet Take 2 tablets once daily in the am for 7 days then decrease to 1 tablet daily for 7 days. Patient not taking: Reported  on 04/08/2024 02/18/24   Fleming, Zelda W, NP  Tiotropium Bromide  Monohydrate (SPIRIVA  RESPIMAT) 2.5 MCG/ACT AERS Inhale 2 puffs into the lungs daily. 02/18/24   Collins Dean, NP  Vitamin D , Ergocalciferol , (DRISDOL ) 1.25 MG (50000 UNIT) CAPS capsule Take 1 capsule (50,000 Units total) by mouth every 7 (seven) days. Patient not taking: Reported on 04/08/2024 01/08/24   Matt Song, MD    Family History Family History  Problem Relation Age of Onset   Anxiety disorder Mother    Diabetes Mother    Obesity Mother    Stroke Mother    ADD / ADHD Daughter    Cancer Maternal Aunt    Diabetes Maternal Grandfather     Social History Social History   Tobacco Use   Smoking status: Former    Types: Cigarettes    Passive exposure: Past   Smokeless tobacco: Never  Vaping Use   Vaping status: Never Used  Substance Use Topics   Alcohol use: Yes    Comment: rarely drink wine   Drug use: No     Allergies   Bee pollen and  Dust mite extract   Review of Systems Review of Systems  Per HPI  Physical Exam Triage Vital Signs ED Triage Vitals  Encounter Vitals Group     BP 04/16/24 1836 108/77     Girls Systolic BP Percentile --      Girls Diastolic BP Percentile --      Boys Systolic BP Percentile --      Boys Diastolic BP Percentile --      Pulse Rate 04/16/24 1836 82     Resp 04/16/24 1836 18     Temp 04/16/24 1836 98.3 F (36.8 C)     Temp Source 04/16/24 1836 Oral     SpO2 04/16/24 1836 92 %     Weight --      Height --      Head Circumference --      Peak Flow --      Pain Score 04/16/24 1833 4     Pain Loc --      Pain Education --      Exclude from Growth Chart --    No data found.  Updated Vital Signs BP 108/77 (BP Location: Left Arm)   Pulse 82   Temp 98.3 F (36.8 C) (Oral)   Resp 18   SpO2 92%   Visual Acuity Right Eye Distance:   Left Eye Distance:   Bilateral Distance:    Right Eye Near:   Left Eye Near:    Bilateral Near:     Physical Exam Vitals and nursing note reviewed.  Constitutional:      Appearance: Normal appearance.  HENT:     Head: Normocephalic and atraumatic.     Right Ear: External ear normal.     Left Ear: External ear normal.     Nose: Nose normal.     Mouth/Throat:     Mouth: Mucous membranes are moist.   Eyes:     Conjunctiva/sclera: Conjunctivae normal.    Cardiovascular:     Rate and Rhythm: Normal rate.  Pulmonary:     Effort: Pulmonary effort is normal. No respiratory distress.   Musculoskeletal:        General: Tenderness present. No swelling, deformity or signs of injury.       Back:     Comments: Subscapular right-sided back pain that is mildly tender to palpation, pain reproducible with movement  and coughing.  Spine without step-off, tenderness or deformity.   Skin:    General: Skin is warm and dry.     Findings: No rash.   Neurological:     General: No focal deficit present.     Mental Status: She is alert.    Psychiatric:        Mood and Affect: Mood normal.      UC Treatments / Results  Labs (all labs ordered are listed, but only abnormal results are displayed) Labs Reviewed - No data to display  EKG   Radiology No results found.  Procedures Procedures (including critical care time)  Medications Ordered in UC Medications  dexamethasone (DECADRON) injection 10 mg (10 mg Intramuscular Given 04/16/24 1914)    Initial Impression / Assessment and Plan / UC Course  I have reviewed the triage vital signs and the nursing notes.  Pertinent labs & imaging results that were available during my care of the patient were reviewed by me and considered in my medical decision making (see chart for details).  Vitals in triage reviewed, patient is hemodynamically stable.  Subscapular back pain is reproducible to palpation and with range of motion, consistent with soft tissue injury such as muscle spasm.  Lungs vesicular, heart with regular rate and rhythm.  Will treat muscle spasm with IM steroid and muscle relaxers as needed.  Stretching and massage encouraged.  Plan of care, follow-up care return precautions given, no questions at this time.     Final Clinical Impressions(s) / UC Diagnoses   Final diagnoses:  Spasm of thoracic back muscle     Discharge Instructions      Your pain is consistent with a muscle spasm.  You can use the muscle relaxer up to 2 times daily, do not drink alcohol or drive on this medication as it may cause drowsiness or sedation.  Heat, gentle stretching and massage can help loosen it up as well.  The steroid should help with pain and inflammation.  For any breakthrough pain you can take Tylenol , avoid NSAIDs such as ibuprofen  as with steroids this can increase your risk for gastrointestinal bleeding.  Should improve over the next few days, if no improvement or any changes follow-up with sports medicine or return to clinic.    ED Prescriptions      Medication Sig Dispense Auth. Provider   methocarbamol (ROBAXIN) 500 MG tablet Take 1 tablet (500 mg total) by mouth 2 (two) times daily. 20 tablet Harlow Lighter, Jocilyn Trego  N, FNP      PDMP not reviewed this encounter.   Mena Stai, FNP 04/16/24 1914

## 2024-04-16 NOTE — Discharge Instructions (Addendum)
 Your pain is consistent with a muscle spasm.  You can use the muscle relaxer up to 2 times daily, do not drink alcohol or drive on this medication as it may cause drowsiness or sedation.  Heat, gentle stretching and massage can help loosen it up as well.  The steroid should help with pain and inflammation.  For any breakthrough pain you can take Tylenol , avoid NSAIDs such as ibuprofen  as with steroids this can increase your risk for gastrointestinal bleeding.  Should improve over the next few days, if no improvement or any changes follow-up with sports medicine or return to clinic.

## 2024-04-16 NOTE — ED Triage Notes (Signed)
 Pt states back pain for two days, no known injury when she coughs it hurts her backtremendously  across middle of back , she has tried Salone patches, when coughs 10/10 just sitting 4/10, and also tried heating pad which also just eases but doesn't take pain away

## 2024-05-02 ENCOUNTER — Other Ambulatory Visit: Payer: Self-pay | Admitting: Pulmonary Disease

## 2024-05-02 DIAGNOSIS — J849 Interstitial pulmonary disease, unspecified: Secondary | ICD-10-CM

## 2024-05-05 ENCOUNTER — Telehealth: Payer: Self-pay

## 2024-05-05 NOTE — Telephone Encounter (Signed)
Sent message to [facility].

## 2024-05-12 ENCOUNTER — Telehealth: Payer: Self-pay

## 2024-05-12 ENCOUNTER — Telehealth: Payer: Self-pay | Admitting: Pulmonary Disease

## 2024-05-12 DIAGNOSIS — R590 Localized enlarged lymph nodes: Secondary | ICD-10-CM | POA: Insufficient documentation

## 2024-05-12 DIAGNOSIS — J849 Interstitial pulmonary disease, unspecified: Secondary | ICD-10-CM | POA: Insufficient documentation

## 2024-05-12 NOTE — Telephone Encounter (Signed)
 Patient had the follow up HRCT Chest at [facility] in May after our follow up.   Please request these images powershared to our EMR for review. This will determine when I can get them in for bronchoscopy and biopsy.  Thanks, JD

## 2024-05-12 NOTE — Telephone Encounter (Signed)
 Dr. Kara,   Per endo, they do not have any openings on 7/23 or 7/24 for a bronch - the scheduled is held for inpatient procedures only.   Do you have any other alternate dates we can try to get scheduled?   Thanks,  [employer]

## 2024-05-12 NOTE — Telephone Encounter (Signed)
 Dr. Kara,   Nothing available on the 25th.  They have openings on 7/31 from 7:30-12:45.

## 2024-05-12 NOTE — Telephone Encounter (Signed)
 Please schedule the following:  Provider performing procedure:Filiberto Wamble Diagnosis: Mediastinal Adenopathy and Interstitial Lung Disease Which side if for nodule / mass? Bilateral Procedure: EBUS and transbronchial biopsies  Has patient been spoken to by Provider and given informed consent? yes Anesthesia: general Do you need Fluro? Yes Duration of procedure: 1 hour Date: 7/23 Alternate Date: 7/24  Time: later morning or early afternoon Location: [facility] Does patient have OSA? no DM? no Or Latex allergy? no Medication Restriction/ Anticoagulate/Antiplatelet: n/a Pre-op Labs Ordered:determined by Anesthesia Imaging request: Request HRCT Chest 02/2024 powershare from [facility]   (If, [facility] CT Chest, please have STAT courier sent to ENDO)

## 2024-05-12 NOTE — Telephone Encounter (Signed)
 Request has been made

## 2024-05-13 NOTE — Telephone Encounter (Addendum)
 Per agreement, Dr. Shelah will perform bronch on 7/22 at 7:30 AM, pt checking in by 5:30 AM.  Scheduled with [facility], Case# [id number].  Pt scheduled for follow up w/ Dr. Kara on 7/30 at 1:30 for results.  (Requested Con to post on schedule as I do not have access to do so).  Letter created & pt aware. Sending to [facility] to authorize.

## 2024-05-15 ENCOUNTER — Encounter (HOSPITAL_COMMUNITY): Payer: Self-pay | Admitting: Emergency Medicine

## 2024-05-15 ENCOUNTER — Other Ambulatory Visit: Payer: Self-pay

## 2024-05-15 NOTE — Progress Notes (Signed)
 PCP - Dr Corrina Sabin Cardiologist - none  CT Chest x-ray - 03/20/24 [facility] EKG - DOS Stress Test - n/a ECHO - n/a Cardiac Cath - n/a  ICD Pacemaker/Loop - n/a  Sleep Study -  n/a  Diabetes - n/a  Aspirin & Blood Thinner Instructions:  n/a.  ERAS   Anesthesia review: Yes  STOP now taking any Aspirin (unless otherwise instructed by your surgeon), Aleve , Naproxen , Ibuprofen , Motrin , Advil , Goody's, BC's, all herbal medications, fish oil, and all vitamins.   Coronavirus Screening Do you have any of the following symptoms:  Cough yes/no: No Fever (>100.45F)  yes/no: No Runny nose yes/no: No Sore throat yes/no: No Difficulty breathing/shortness of breath  yes/no: No  Have you traveled in the last 14 days and where? yes/no: No  Patient verbalized understanding of instructions that were given via phone.

## 2024-05-19 ENCOUNTER — Ambulatory Visit (HOSPITAL_COMMUNITY)
Admission: RE | Admit: 2024-05-19 | Discharge: 2024-05-19 | Disposition: A | Discharge status: Home / Self Care | Attending: Emergency Medicine | Admitting: Emergency Medicine

## 2024-05-19 ENCOUNTER — Encounter (HOSPITAL_COMMUNITY)
Admission: RE | Disposition: A | Discharge status: Home / Self Care | Payer: Self-pay | Source: Home / Self Care | Attending: Emergency Medicine

## 2024-05-19 ENCOUNTER — Other Ambulatory Visit: Payer: Self-pay

## 2024-05-19 ENCOUNTER — Ambulatory Visit (HOSPITAL_COMMUNITY): Payer: Self-pay | Admitting: Physician Assistant

## 2024-05-19 ENCOUNTER — Encounter (HOSPITAL_COMMUNITY): Payer: Self-pay | Admitting: Emergency Medicine

## 2024-05-19 ENCOUNTER — Ambulatory Visit (HOSPITAL_COMMUNITY)

## 2024-05-19 DIAGNOSIS — J849 Interstitial pulmonary disease, unspecified: Secondary | ICD-10-CM

## 2024-05-19 DIAGNOSIS — R590 Localized enlarged lymph nodes: Secondary | ICD-10-CM

## 2024-05-19 DIAGNOSIS — J4489 Other specified chronic obstructive pulmonary disease: Secondary | ICD-10-CM | POA: Insufficient documentation

## 2024-05-19 DIAGNOSIS — Z87891 Personal history of nicotine dependence: Secondary | ICD-10-CM | POA: Diagnosis not present

## 2024-05-19 DIAGNOSIS — R918 Other nonspecific abnormal finding of lung field: Secondary | ICD-10-CM | POA: Insufficient documentation

## 2024-05-19 DIAGNOSIS — Z48813 Encounter for surgical aftercare following surgery on the respiratory system: Secondary | ICD-10-CM | POA: Diagnosis not present

## 2024-05-19 DIAGNOSIS — I10 Essential (primary) hypertension: Secondary | ICD-10-CM | POA: Insufficient documentation

## 2024-05-19 DIAGNOSIS — J984 Other disorders of lung: Secondary | ICD-10-CM | POA: Diagnosis not present

## 2024-05-19 HISTORY — DX: Anemia, unspecified: D64.9

## 2024-05-19 HISTORY — PX: VIDEO BRONCHOSCOPY WITH ENDOBRONCHIAL ULTRASOUND: SHX6177

## 2024-05-19 HISTORY — DX: Bipolar disorder, unspecified: F31.9

## 2024-05-19 HISTORY — DX: Dyspnea, unspecified: R06.00

## 2024-05-19 LAB — CBC
HCT: 40.3 % (ref 36.0–46.0)
Hemoglobin: 13 g/dL (ref 12.0–15.0)
MCH: 27.6 pg (ref 26.0–34.0)
MCHC: 32.3 g/dL (ref 30.0–36.0)
MCV: 85.6 fL (ref 80.0–100.0)
Platelets: 318 K/uL (ref 150–400)
RBC: 4.71 MIL/uL (ref 3.87–5.11)
RDW: 15.2 % (ref 11.5–15.5)
WBC: 10.3 K/uL (ref 4.0–10.5)
nRBC: 0 % (ref 0.0–0.2)

## 2024-05-19 LAB — BASIC METABOLIC PANEL WITH GFR
Anion gap: 9 (ref 5–15)
BUN: 9 mg/dL (ref 6–20)
CO2: 22 mmol/L (ref 22–32)
Calcium: 9.4 mg/dL (ref 8.9–10.3)
Chloride: 103 mmol/L (ref 98–111)
Creatinine, Ser: 0.86 mg/dL (ref 0.44–1.00)
GFR, Estimated: >60 mL/min (ref >60)
Glucose, Bld: 99 mg/dL (ref 70–99)
Potassium: 4 mmol/L (ref 3.5–5.1)
Sodium: 134 mmol/L — ABNORMAL LOW (ref 135–145)

## 2024-05-19 LAB — POCT PREGNANCY, URINE: Preg Test, Ur: NEGATIVE

## 2024-05-19 SURGERY — BRONCHOSCOPY, WITH EBUS
Anesthesia: General | Laterality: Bilateral

## 2024-05-19 MED ORDER — LIDOCAINE 2% (20 MG/ML) 5 ML SYRINGE
INTRAMUSCULAR | Status: DC | PRN
  Administered 2024-05-19: 100 mg via INTRAVENOUS

## 2024-05-19 MED ORDER — LACTATED RINGERS IV SOLN: INTRAVENOUS | Status: DC

## 2024-05-19 MED ORDER — OXYCODONE HCL 5 MG/5ML PO SOLN: 5.0000 mg | Freq: Once | ORAL | Status: DC | PRN

## 2024-05-19 MED ORDER — SUGAMMADEX SODIUM 200 MG/2ML IV SOLN
INTRAVENOUS | Status: DC | PRN
  Administered 2024-05-19: 200 mg via INTRAVENOUS

## 2024-05-19 MED ORDER — DEXAMETHASONE SODIUM PHOSPHATE 10 MG/ML IJ SOLN
INTRAMUSCULAR | Status: DC | PRN
  Administered 2024-05-19: 10 mg via INTRAVENOUS

## 2024-05-19 MED ORDER — OXYCODONE HCL 5 MG PO TABS: 5.0000 mg | ORAL_TABLET | Freq: Once | ORAL | Status: DC | PRN

## 2024-05-19 MED ORDER — CHLORHEXIDINE GLUCONATE 0.12 % MT SOLN
15.0000 mL | Freq: Once | OROMUCOSAL | Status: AC
  Administered 2024-05-19: 15 mL via OROMUCOSAL
  Filled 2024-05-19: qty 15

## 2024-05-19 MED ORDER — ROCURONIUM BROMIDE 10 MG/ML (PF) SYRINGE
PREFILLED_SYRINGE | INTRAVENOUS | Status: DC | PRN
  Administered 2024-05-19: 60 mg via INTRAVENOUS

## 2024-05-19 MED ORDER — ONDANSETRON HCL 4 MG/2ML IJ SOLN
INTRAMUSCULAR | Status: DC | PRN
  Administered 2024-05-19: 4 mg via INTRAVENOUS

## 2024-05-19 MED ORDER — ONDANSETRON HCL 4 MG/2ML IJ SOLN: 4.0000 mg | Freq: Four times a day (QID) | INTRAMUSCULAR | Status: DC | PRN

## 2024-05-19 MED ORDER — FENTANYL CITRATE (PF) 250 MCG/5ML IJ SOLN
INTRAMUSCULAR | Status: DC | PRN
  Administered 2024-05-19 (×2): 50 ug via INTRAVENOUS

## 2024-05-19 MED ORDER — PROPOFOL 10 MG/ML IV BOLUS
INTRAVENOUS | Status: DC | PRN
  Administered 2024-05-19: 150 mg via INTRAVENOUS

## 2024-05-19 MED ORDER — FENTANYL CITRATE (PF) 100 MCG/2ML IJ SOLN: 25.0000 ug | INTRAMUSCULAR | Status: DC | PRN

## 2024-05-19 MED ORDER — MIDAZOLAM HCL 2 MG/2ML IJ SOLN
INTRAMUSCULAR | Status: DC | PRN
  Administered 2024-05-19: 2 mg via INTRAVENOUS

## 2024-05-19 NOTE — Discharge Instructions (Addendum)

## 2024-05-19 NOTE — H&P (Signed)
 Brianna Jordan is an 38 y.o. female.   Chief Complaint: Mediastinal adenopathy, bilateral pulmonary infiltrates HPI:  38 year old former smoker with a history of hypertension, GERD, asthma who is followed by Dr. Kara in our office for an abnormal CT scan of the chest.  She has been evaluated by rheumatology for dyspnea, cough, myalgias and arthralgias.  Chest imaging has shown some mediastinal lymphadenopathy as well as bilateral groundglass pulmonary infiltrates with a craniocaudal gradient, cyst or scar formation.  She is referred now for bronchoscopy to evaluate the lymphadenopathy and pulmonary infiltrates. She feels well.  Has baseline dyspnea with exertion and cough with minimal sputum production.  No new symptoms reported.  Past Medical History:  Diagnosis Date   Allergy    When its pollen season   Anemia    as a child, no problems as an adult   Anxiety    reports dx from previous MD   Arthritis    Asthma    After i caught covid-19 in in 2023   Bipolar disorder (HCC)    no meds per patient   Dyspnea    with exertion   GERD (gastroesophageal reflux disease)    Hypertension    no meds   Osteoarthritis    Other emphysema (HCC) 12/02/2023    Past Surgical History:  Procedure Laterality Date   APPENDECTOMY     CESAREAN SECTION     x 1   FOOT SURGERY Right 2021   WISDOM TOOTH EXTRACTION      Family History  Problem Relation Age of Onset   Anxiety disorder Mother    Diabetes Mother    Obesity Mother    Stroke Mother    ADD / ADHD Daughter    Cancer Maternal Aunt    Diabetes Maternal Grandfather    Social History:  reports that she has quit smoking. Her smoking use included cigarettes. She has been exposed to tobacco smoke. She has never used smokeless tobacco. She reports current alcohol use. She reports that she does not use drugs.  Allergies:  Allergies  Allergen Reactions   Bee Pollen    Dust Mite Extract    Shrimp (Diagnostic)     Swelling in mouth,  nausea, vomiting    Medications Prior to Admission  Medication Sig Dispense Refill   methocarbamol  (ROBAXIN ) 500 MG tablet Take 1 tablet (500 mg total) by mouth 2 (two) times daily. 20 tablet 0   Tiotropium Bromide  Monohydrate (SPIRIVA  RESPIMAT) 2.5 MCG/ACT AERS Inhale 2 puffs into the lungs daily. 4 g 1   WIXELA INHUB 250-50 MCG/ACT AEPB INHALE 1 PUFF INTO THE LUNGS IN THE MORNING AND 1 PUFF AT BEDTIME 180 each 2   acetaminophen  (TYLENOL ) 325 MG tablet Take 650 mg by mouth every 6 (six) hours as needed.     albuterol  (VENTOLIN  HFA) 108 (90 Base) MCG/ACT inhaler Inhale 2 puffs into the lungs every 6 (six) hours as needed for wheezing or shortness of breath. 8 g 2   metroNIDAZOLE  (FLAGYL ) 500 MG tablet TAKE 1 TABLET BY MOUTH TWICE A DAY FOR 7 DAYS 14 tablet 0   montelukast  (SINGULAIR ) 10 MG tablet TAKE 1 TABLET BY MOUTH EVERYDAY AT BEDTIME 90 tablet 1   Multiple Vitamin (MULTIVITAMIN PO) Take by mouth. (Patient not taking: Reported on 04/08/2024)     predniSONE  (DELTASONE ) 10 MG tablet Take 2 tablets once daily in the am for 7 days then decrease to 1 tablet daily for 7 days. (Patient not taking: Reported on 04/08/2024)  21 tablet 0   Vitamin D , Ergocalciferol , (DRISDOL ) 1.25 MG (50000 UNIT) CAPS capsule Take 1 capsule (50,000 Units total) by mouth every 7 (seven) days. (Patient not taking: Reported on 04/08/2024) 12 capsule 0    Results for orders placed or performed during the hospital encounter of 05/19/24 (from the past 48 hours)  Basic metabolic panel per protocol     Status: Abnormal   Collection Time: 05/19/24  5:57 AM  Result Value Ref Range   Sodium 134 (L) 135 - 145 mmol/L   Potassium 4.0 3.5 - 5.1 mmol/L   Chloride 103 98 - 111 mmol/L   CO2 22 22 - 32 mmol/L   Glucose, Bld 99 70 - 99 mg/dL    Comment: Glucose reference range applies only to samples taken after fasting for at least 8 hours.   BUN 9 6 - 20 mg/dL   Creatinine, Ser 9.13 0.44 - 1.00 mg/dL   Calcium 9.4 8.9 - 89.6 mg/dL    GFR, Estimated >39 >39 mL/min    Comment: (NOTE) Calculated using the CKD-EPI Creatinine Equation (2021)    Anion gap 9 5 - 15    Comment: Performed at [facility], [address], [place], [place] [address]  CBC per protocol     Status: None   Collection Time: 05/19/24  5:57 AM  Result Value Ref Range   WBC 10.3 4.0 - 10.5 K/uL   RBC 4.71 3.87 - 5.11 MIL/uL   Hemoglobin 13.0 12.0 - 15.0 g/dL   HCT 59.6 63.9 - 53.9 %   MCV 85.6 80.0 - 100.0 fL   MCH 27.6 26.0 - 34.0 pg   MCHC 32.3 30.0 - 36.0 g/dL   RDW 84.7 88.4 - 84.4 %   Platelets 318 150 - 400 K/uL   nRBC 0.0 0.0 - 0.2 %    Comment: Performed at [facility], [address], [place], [place] [address]  Pregnancy, urine POC     Status: None   Collection Time: 05/19/24  6:42 AM  Result Value Ref Range   Preg Test, Ur NEGATIVE NEGATIVE    Comment:        THE SENSITIVITY OF THIS METHODOLOGY IS >20 mIU/mL.    No results found.  Review of Systems As per HPI Blood pressure 118/83, pulse 85, temperature 98.2 F (36.8 C), temperature source Oral, resp. rate 18, height 5' 6 (1.676 m), weight 99.8 kg, SpO2 95%. Physical Exam  Gen: Pleasant, well-nourished, in no distress,  normal affect  ENT: No lesions,  mouth clear,  oropharynx clear, no postnasal drip  Neck: No JVD, no stridor  Lungs: No use of accessory muscles, few bilateral basilar inspiratory crackles  Cardiovascular: RRR, heart sounds normal, no murmur or gallops, no peripheral edema  Abdomen: soft and NT, no HSM,  BS normal  Musculoskeletal: No deformities, no cyanosis or clubbing  Neuro: alert, awake, non focal  Skin: Warm, no lesions or rashes    Assessment/Plan Bilateral pulmonary infiltrates with subpleural scar, unclear etiology.  Associated with mediastinal adenopathy.  We are planning for bronchoscopy with transbronchial biopsies for pathology, endobronchial ultrasound for nodal biopsies.  She understands the risk, benefits, rationale  of the procedure and agrees to proceed.  No barriers identified.   Lamar GORMAN Chris, MD 05/19/2024, 7:20 AM

## 2024-05-19 NOTE — Anesthesia Procedure Notes (Signed)
 Procedure Name: Intubation Date/Time: 05/19/2024 7:42 AM  Performed by: Sharie Joesph BRAVO, RNPre-anesthesia Checklist: Patient identified, Emergency Drugs available, Suction available and Patient being monitored Patient Re-evaluated:Patient Re-evaluated prior to induction Oxygen Delivery Method: Circle system utilized Preoxygenation: Pre-oxygenation with 100% oxygen Induction Type: IV induction Ventilation: Mask ventilation without difficulty Laryngoscope Size: 3 and Mac Grade View: Grade I Tube type: Oral Tube size: 8.5 mm Number of attempts: 1 Airway Equipment and Method: Stylet and Oral airway Placement Confirmation: ETT inserted through vocal cords under direct vision, positive ETCO2 and breath sounds checked- equal and bilateral Secured at: 23 cm Tube secured with: Tape Dental Injury: Teeth and Oropharynx as per pre-operative assessment

## 2024-05-19 NOTE — Anesthesia Preprocedure Evaluation (Signed)
 Anesthesia Evaluation  Patient identified by MRN, date of birth, ID band Patient awake    Reviewed: Allergy & Precautions, H&P , NPO status , Patient's Chart, lab work & pertinent test results  Airway Mallampati: II   Neck ROM: full    Dental   Pulmonary shortness of breath, asthma , COPD, former smoker   breath sounds clear to auscultation       Cardiovascular hypertension,  Rhythm:regular Rate:Normal     Neuro/Psych  PSYCHIATRIC DISORDERS Anxiety  Bipolar Disorder      GI/Hepatic ,GERD  ,,  Endo/Other    Renal/GU      Musculoskeletal  (+) Arthritis ,    Abdominal   Peds  Hematology   Anesthesia Other Findings   Reproductive/Obstetrics                              Anesthesia Physical Anesthesia Plan  ASA: 2  Anesthesia Plan: General   Post-op Pain Management:    Induction: Intravenous  PONV Risk Score and Plan: 3 and Ondansetron , Dexamethasone , Midazolam  and Treatment may vary due to age or medical condition  Airway Management Planned: Oral ETT  Additional Equipment:   Intra-op Plan:   Post-operative Plan: Extubation in OR  Informed Consent: I have reviewed the patients History and Physical, chart, labs and discussed the procedure including the risks, benefits and alternatives for the proposed anesthesia with the patient or authorized representative who has indicated his/her understanding and acceptance.     Dental advisory given  Plan Discussed with: CRNA, Anesthesiologist and Surgeon  Anesthesia Plan Comments:         Anesthesia Quick Evaluation

## 2024-05-19 NOTE — Transfer of Care (Signed)
 Immediate Anesthesia Transfer of Care Note  Patient: Brianna Jordan  Procedure(s) Performed: BRONCHOSCOPY, WITH EBUS (Bilateral)  Patient Location: PACU  Anesthesia Type:General  Level of Consciousness: awake, alert , oriented, and patient cooperative  Airway & Oxygen Therapy: Patient Spontanous Breathing and Patient connected to face mask oxygen  Post-op Assessment: Report given to RN, Post -op Vital signs reviewed and stable, Patient moving all extremities, Patient moving all extremities X 4, and Patient able to stick tongue midline  Post vital signs: Reviewed and stable  Last Vitals:  Vitals Value Taken Time  BP 111/72 05/19/24 08:45  Temp 37 C 05/19/24 08:45  Pulse 99 05/19/24 08:48  Resp 23 05/19/24 08:48  SpO2 90 % 05/19/24 08:48  Vitals shown include unfiled device data.  Last Pain:  Vitals:   05/19/24 0845  TempSrc:   PainSc: 0-No pain         Complications: No notable events documented.

## 2024-05-19 NOTE — Op Note (Signed)
 Video Bronchoscopy with Endobronchial Ultrasound Procedure Note  Date of Operation: 05/19/2024  Pre-op Diagnosis: Bilateral pulmonary infiltrates, mediastinal adenopathy  Post-op Diagnosis: Same  Surgeon: LAMAR CHRIS  Assistants: None  Anesthesia: General endotracheal anesthesia  Operation: Flexible video fiberoptic bronchoscopy with endobronchial ultrasound and biopsies.  Estimated Blood Loss: Minimal  Complications: None apparent  Indications and History: Brianna Jordan is a 38 y.o. female with history of asthma, cough, dyspnea, possible rheumatological symptoms including myalgias and arthralgias.  Chest imaging has revealed mediastinal adenopathy as well as bilateral groundglass infiltrates and interstitial disease.  Recommendation made to achieve a tissue diagnosis via endobronchial ultrasound with biopsies, transbronchial brushings and biopsies.  The risks, benefits, complications, treatment options and expected outcomes were discussed with the patient.  The possibilities of pneumothorax, pneumonia, reaction to medication, pulmonary aspiration, perforation of a viscus, bleeding, failure to diagnose a condition and creating a complication requiring transfusion or operation were discussed with the patient who freely signed the consent.    Description of Procedure: The patient was examined in the preoperative area and history and data from the preprocedure consultation were reviewed. It was deemed appropriate to proceed.  The patient was taken to [facility] Endoscopy room 3, identified as Jeanene Minus and the procedure verified as Flexible Video Fiberoptic Bronchoscopy.  A Time Out was held and the above information confirmed. After being taken to the operating room general anesthesia was initiated and the patient  was orally intubated. The video fiberoptic bronchoscope was introduced via the endotracheal tube and a general inspection was performed which showed normal airways  throughout.  There were no endobronchial lesions or abnormal secretions seen.  Transbronchial brushings were performed under fluoroscopic guidance in the right lower lobe to be sent for cytology.  Transbronchial forceps biopsies were then performed in multiple subsegments of the right lower lobe to be sent for pathology.  A bronchoalveolar lavage with 60 cc normal saline instilled and approximately 25 cc returned was performed in the anterior segment of the right upper lobe to be sent for microbiology. The standard scope was then withdrawn and the endobronchial ultrasound was used to identify and characterize the peritracheal, hilar and bronchial lymph nodes. Inspection showed enlargement at station 4L, 4R, 7, 10R. Using real-time ultrasound guidance Wang needle biopsies were take from Station 4L, 7, 10R nodes and were sent for cytology. The patient tolerated the procedure well without apparent complications. There was no significant blood loss. The bronchoscope was withdrawn. Anesthesia was reversed and the patient was taken to the PACU for recovery.   Samples: 1. Wang needle biopsies from 4L node 2. Wang needle biopsies from 7 node 3. Wang needle biopsies from 10R node 4.  Transbronchial brushings from the right lower lobe 5.  Transbronchial forceps biopsies from the right lower lobe 6.  Bronchoalveolar lavage from the right upper lobe  Plans:  The patient will be discharged from the PACU to home when recovered from anesthesia. We will review the cytology, pathology and microbiology results with the patient when they become available. Outpatient followup will be with Dr. Kara.    Carson Bogden S. 05/19/2024

## 2024-05-19 NOTE — Anesthesia Postprocedure Evaluation (Signed)
 Anesthesia Post Note  Patient: Brianna Jordan  Procedure(s) Performed: BRONCHOSCOPY, WITH EBUS (Bilateral)     Patient location during evaluation: PACU Anesthesia Type: General Level of consciousness: awake and alert Pain management: pain level controlled Vital Signs Assessment: post-procedure vital signs reviewed and stable Respiratory status: spontaneous breathing, nonlabored ventilation, respiratory function stable and patient connected to nasal cannula oxygen Cardiovascular status: blood pressure returned to baseline and stable Postop Assessment: no apparent nausea or vomiting Anesthetic complications: no   No notable events documented.  Last Vitals:  Vitals:   05/19/24 0915 05/19/24 0920  BP: 105/74   Pulse: 81 77  Resp: 16 18  Temp: 37 C   SpO2: 100% 95%    Last Pain:  Vitals:   05/19/24 0915  TempSrc:   PainSc: 0-No pain                 Nathan Stallworth S

## 2024-05-20 LAB — FUNGUS STAIN

## 2024-05-20 LAB — CYTOLOGY - NON PAP

## 2024-05-20 LAB — FUNGAL STAIN REFLEX

## 2024-05-21 ENCOUNTER — Other Ambulatory Visit: Payer: Self-pay | Admitting: Nurse Practitioner

## 2024-05-21 DIAGNOSIS — D860 Sarcoidosis of lung: Secondary | ICD-10-CM

## 2024-05-21 LAB — SURGICAL PATHOLOGY

## 2024-05-21 LAB — CULTURE, BAL-QUANTITATIVE W GRAM STAIN: Culture: 1000 — AB

## 2024-05-21 LAB — ACID FAST SMEAR (AFB, MYCOBACTERIA): Acid Fast Smear: NEGATIVE

## 2024-05-23 LAB — AEROBIC/ANAEROBIC CULTURE W GRAM STAIN (SURGICAL/DEEP WOUND): Culture: NORMAL

## 2024-05-25 ENCOUNTER — Other Ambulatory Visit: Payer: Self-pay

## 2024-05-27 ENCOUNTER — Ambulatory Visit (INDEPENDENT_AMBULATORY_CARE_PROVIDER_SITE_OTHER): Admitting: Pulmonary Disease

## 2024-05-27 ENCOUNTER — Encounter: Payer: Self-pay | Admitting: Pulmonary Disease

## 2024-05-27 VITALS — BP 116/80 | HR 81 | Ht 66.0 in | Wt 226.0 lb

## 2024-05-27 DIAGNOSIS — J849 Interstitial pulmonary disease, unspecified: Secondary | ICD-10-CM

## 2024-05-27 LAB — TSH: TSH: 4.55 u[IU]/mL (ref 0.35–5.50)

## 2024-05-27 MED ORDER — SPIRIVA RESPIMAT 2.5 MCG/ACT IN AERS: 2.0000 | INHALATION_SPRAY | Freq: Every day | RESPIRATORY_TRACT | 5 refills | Status: AC

## 2024-05-27 NOTE — Patient Instructions (Addendum)
 Schedule pulmonary function tests at front desk  We will check TSH level and Myositis panel today  I will discuss bronchoscopy results with Dr. Jeannetta.   I will reach out to you once I hear from him.  Follow up 3 months

## 2024-05-27 NOTE — Progress Notes (Unsigned)
 Synopsis: Referred in May 2025 for Sarcoidosis  Subjective:   PATIENT ID: Brianna Jordan GENDER: female DOB: [birth year], MRN: [id number]  HPI  Chief Complaint  Patient presents with   Medical Management of Chronic Issues   Brianna Jordan is a 38 year old female, former smoker with history of GERD, hypertension and asthma who retuns to pulmonary clinic for concern of sarcoidosis.  OV 02/2024 She was seen by Dr. Jeannetta of rheumatology on 12/31/23, note reviewed.  She experiences persistent coughing and shortness of breath, worsened by physical activity and alleviated when seated. She experiences choking during sleep and possible snoring. Daytime sleepiness and frequent coughing fits with mucus production are present. Cold weather exacerbates her symptoms. She denies post-nasal drainage. She reports improvement in her prednisone  and spiriva  with improvement in her symptoms while on prednisone . She has diffuse muscle and joint aches.  She had covid 19 infection December 2023. 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 She has a history of smoking, sharing a pack every other day from age 29 until quitting in 2019.   HRCT Chest 10/2023 showed peribronchovascular nodularity and ground glass infiltrates with craniocaudal gradient. Subpleural emphysema vs cyst formation in areas of ground glass attenuation. Mediastinal adenopathy present at station 7.  FH: Cousin on mother's side, has MS  OV 05/27/24 Patient underwent bronchoscopy 05/19/24 with with biopsies of 4L, 7 and 10R with cytology showing normal lymphoid tissue. Transbronchial biopsies showed bronchial epithelium and cartilage and benign bronchial cells.   Reviewed cytology/pathology results with patient.   She is having joint pains in her knees and hands. She did have skin rash along her hairline and around her nose and cheeks previously.  Past Medical History:  Diagnosis Date   Allergy    When its pollen season    Anemia    as a child, no problems as an adult   Anxiety    reports dx from previous MD   Arthritis    Asthma    After i caught covid-19 in in 2023   Bipolar disorder (HCC)    no meds per patient   Dyspnea    with exertion   GERD (gastroesophageal reflux disease)    Hypertension    no meds   Osteoarthritis    Other emphysema (HCC) 12/02/2023     Family History  Problem Relation Age of Onset   Anxiety disorder Mother    Diabetes Mother    Obesity Mother    Stroke Mother    ADD / ADHD Daughter    Cancer Maternal Aunt    Diabetes Maternal Grandfather      Social History   Socioeconomic History   Marital status: Married    Spouse name: Not on file   Number of children: 2   Years of education: Not on file   Highest education level: 12th grade  Occupational History   Not on file  Tobacco Use   Smoking status: Former    Current packs/day: 0.50    Types: Cigarettes    Passive exposure: Past   Smokeless tobacco: Never   Tobacco comments:    Smoke 1/2 ppd, Patient quit smoking in 2021.  Vaping Use   Vaping status: Never Used  Substance and Sexual Activity   Alcohol use: Yes    Comment: occasional wine   Drug use: No   Sexual activity: Yes    Birth control/protection: Implant    Comment: Nexplanon   Other Topics Concern   Not on  file  Social History Narrative   Not on file   Social Drivers of Health   Financial Resource Strain: Medium Risk (07/17/2023)   Overall Financial Resource Strain (CARDIA)    Difficulty of Paying Living Expenses: Somewhat hard  Food Insecurity: Food Insecurity Present (07/17/2023)   Hunger Vital Sign    Worried About Running Out of Food in the Last Year: Sometimes true    Ran Out of Food in the Last Year: Sometimes true  Transportation Needs: No Transportation Needs (07/17/2023)   PRAPARE - Administrator, Civil Service (Medical): No    Lack of Transportation (Non-Medical): No  Physical Activity: Insufficiently Active  (07/17/2023)   Exercise Vital Sign    Days of Exercise per Week: 6 days    Minutes of Exercise per Session: 20 min  Stress: No Stress Concern Present (07/17/2023)   [employer] of Occupational Health - Occupational Stress Questionnaire    Feeling of Stress : Only a little  Social Connections: Moderately Isolated (07/17/2023)   Social Connection and Isolation Panel    Frequency of Communication with Friends and Family: Three times a week    Frequency of Social Gatherings with Friends and Family: Once a week    Attends Religious Services: Never    Database administrator or Organizations: No    Attends Engineer, structural: Not on file    Marital Status: Married  Catering manager Violence: Not on file     Allergies  Allergen Reactions   Bee Pollen    Dust Mite Extract    Shrimp (Diagnostic)     Swelling in mouth, nausea, vomiting     Outpatient Medications Prior to Visit  Medication Sig Dispense Refill   acetaminophen  (TYLENOL ) 325 MG tablet Take 650 mg by mouth every 6 (six) hours as needed.     albuterol  (VENTOLIN  HFA) 108 (90 Base) MCG/ACT inhaler Inhale 2 puffs into the lungs every 6 (six) hours as needed for wheezing or shortness of breath. 8 g 2   methocarbamol  (ROBAXIN ) 500 MG tablet Take 1 tablet (500 mg total) by mouth 2 (two) times daily. 20 tablet 0   metroNIDAZOLE  (FLAGYL ) 500 MG tablet TAKE 1 TABLET BY MOUTH TWICE A DAY FOR 7 DAYS 14 tablet 0   Multiple Vitamin (MULTIVITAMIN PO) Take by mouth.     WIXELA INHUB 250-50 MCG/ACT AEPB INHALE 1 PUFF INTO THE LUNGS IN THE MORNING AND 1 PUFF AT BEDTIME 180 each 2   Tiotropium Bromide  Monohydrate (SPIRIVA  RESPIMAT) 2.5 MCG/ACT AERS Inhale 2 puffs into the lungs daily. 4 g 1   No facility-administered medications prior to visit.   Review of Systems  Constitutional:  Negative for chills, fever, malaise/fatigue and weight loss.  HENT:  Negative for congestion, sinus pain and sore throat.   Eyes: Negative.    Respiratory:  Positive for cough and shortness of breath. Negative for hemoptysis, sputum production and wheezing.   Cardiovascular:  Negative for chest pain, palpitations, orthopnea, claudication and leg swelling.  Gastrointestinal:  Negative for abdominal pain, heartburn, nausea and vomiting.  Genitourinary: Negative.   Musculoskeletal:  Positive for back pain, joint pain and myalgias.  Skin:  Negative for rash.  Neurological:  Positive for headaches. Negative for weakness.  Endo/Heme/Allergies: Negative.   Psychiatric/Behavioral: Negative.     Objective:   Vitals:   05/27/24 1319  BP: 116/80  Pulse: 81  SpO2: 98%  Weight: 226 lb (102.5 kg)  Height: 5' 6 (1.676 m)  Physical Exam Constitutional:      General: She is not in acute distress.    Appearance: Normal appearance.  Eyes:     General: No scleral icterus.    Conjunctiva/sclera: Conjunctivae normal.  Cardiovascular:     Rate and Rhythm: Normal rate and regular rhythm.  Pulmonary:     Breath sounds: Decreased air movement present. No wheezing, rhonchi or rales.  Musculoskeletal:     Right lower leg: No edema.     Left lower leg: No edema.  Skin:    General: Skin is warm and dry.  Neurological:     General: No focal deficit present.    CBC    Component Value Date/Time   WBC 10.3 05/19/2024 0557   RBC 4.71 05/19/2024 0557   HGB 13.0 05/19/2024 0557   HGB 11.8 09/06/2020 0942   HCT 40.3 05/19/2024 0557   HCT 36.2 09/06/2020 0942   PLT 318 05/19/2024 0557   PLT 274 09/06/2020 0942   MCV 85.6 05/19/2024 0557   MCV 87 09/06/2020 0942   MCH 27.6 05/19/2024 0557   MCHC 32.3 05/19/2024 0557   RDW 15.2 05/19/2024 0557   RDW 12.8 09/06/2020 0942   LYMPHSABS 1.8 03/12/2024 1021   LYMPHSABS 2.3 07/10/2018 1010   MONOABS 0.8 03/12/2024 1021   EOSABS 0.5 03/12/2024 1021   EOSABS 0.1 07/10/2018 1010   BASOSABS 0.1 03/12/2024 1021   BASOSABS 0.0 07/10/2018 1010      Latest Ref Rng & Units 05/19/2024    5:57  AM 03/12/2024   10:21 AM 07/18/2023   10:49 AM  BMP  Glucose 70 - 99 mg/dL 99  893  76   BUN 6 - 20 mg/dL 9  5  9    Creatinine 0.44 - 1.00 mg/dL 9.13  9.30  9.15   BUN/Creat Ratio 9 - 23   11   Sodium 135 - 145 mmol/L 134  136  137   Potassium 3.5 - 5.1 mmol/L 4.0  3.5  4.4   Chloride 98 - 111 mmol/L 103  106  102   CO2 22 - 32 mmol/L 22  22  21    Calcium 8.9 - 10.3 mg/dL 9.4  9.3  9.4    Chest imaging: HRCT Chest 03/20/24 1.  Finding is suggestive of fibrotic interstitial lung disease. 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  Small pericardial effusion.  3.  Multistation mediastinal lymphadenopathy.  4.  Enlargement of the pulmonary trunk compatible with pulmonary hypertension.   HRCT Chest 11/19/23 Mediastinum/Nodes: Small mediastinal lymph nodes. Subcarinal lymph node measures approximately 2.0 cm. Hilar regions are difficult to definitively evaluate without IV contrast. No axillary adenopathy. Hilar regions are difficult to definitively evaluate without IV contrast. Esophagus is grossly unremarkable.   Lungs/Pleura: Peribronchovascular ill-defined nodularity and ground-glass in a craniocaudal gradient. Mild associated pulmonary parenchymal coarsening. Nodularity along the fissures. Mild centrilobular and paraseptal emphysema. No pleural fluid. Airway is unremarkable. No air trapping.  PFT:     No data to display          Labs: ANA 1:80 speckled RNP + ESR 56 ACE negative SSA/SSB negative C3/C4 negative  Path:  Echo:  Heart Catheterization:    Assessment & Plan:   ILD (interstitial lung disease) (HCC) - Plan: MyoMarker 3 Plus Profile (RDL), Pulmonary Function Test, Tiotropium Bromide  Monohydrate (SPIRIVA  RESPIMAT) 2.5 MCG/ACT AERS, TSH, TSH, MyoMarker 3 Plus Profile (RDL)  Discussion: Zykeriah Mathia is  a 39 year old female, former smoker with history of GERD, hypertension and  asthma who returns to pulmonary clinic for concern of sarcoidosis.  Inflammatory Lung Disease Mediastinal Adenopathy Differential includes autoimmune conditions such as lupus given positive ANA or other mixed connective tissue disease. Suspicion for saroidosis is lower as recent mediastinal biopsies do not show granulomatous inflammation. - check myositis panel - check TSH - check PFTs - continue spiriva  and add advair diskus 250-50mcg 1 puff twice daily - will discuss case with rheumatology  Snoring - home sleep study ordered at last visit for concern of sleep apnea - BMI 36  Follow up in 3 month  Dorn Chill, MD [facility] Office: [phone]    Current Outpatient Medications:    acetaminophen  (TYLENOL ) 325 MG tablet, Take 650 mg by mouth every 6 (six) hours as needed., Disp: , Rfl:    albuterol  (VENTOLIN  HFA) 108 (90 Base) MCG/ACT inhaler, Inhale 2 puffs into the lungs every 6 (six) hours as needed for wheezing or shortness of breath., Disp: 8 g, Rfl: 2   methocarbamol  (ROBAXIN ) 500 MG tablet, Take 1 tablet (500 mg total) by mouth 2 (two) times daily., Disp: 20 tablet, Rfl: 0   metroNIDAZOLE  (FLAGYL ) 500 MG tablet, TAKE 1 TABLET BY MOUTH TWICE A DAY FOR 7 DAYS, Disp: 14 tablet, Rfl: 0   Multiple Vitamin (MULTIVITAMIN PO), Take by mouth., Disp: , Rfl:    WIXELA INHUB 250-50 MCG/ACT AEPB, INHALE 1 PUFF INTO THE LUNGS IN THE MORNING AND 1 PUFF AT BEDTIME, Disp: 180 each, Rfl: 2   Tiotropium Bromide  Monohydrate (SPIRIVA  RESPIMAT) 2.5 MCG/ACT AERS, Inhale 2 puffs into the lungs daily., Disp: 4 g, Rfl: 5

## 2024-05-28 ENCOUNTER — Ambulatory Visit: Payer: Self-pay | Admitting: Pulmonary Disease

## 2024-05-29 DIAGNOSIS — M3500 Sicca syndrome, unspecified: Secondary | ICD-10-CM

## 2024-05-29 HISTORY — DX: Sjogren syndrome, unspecified: M35.00
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
## 2024-06-10 ENCOUNTER — Ambulatory Visit: Admitting: Internal Medicine

## 2024-06-10 DIAGNOSIS — D860 Sarcoidosis of lung: Secondary | ICD-10-CM

## 2024-06-10 DIAGNOSIS — E559 Vitamin D deficiency, unspecified: Secondary | ICD-10-CM

## 2024-06-13 LAB — MYOMARKER 3 PLUS PROFILE (RDL)
Anti-EJ Ab (RDL): NEGATIVE
Anti-Jo-1 Ab (RDL): <20 U (ref <20)
Anti-Ku Ab (RDL): NEGATIVE
Anti-MDA-5 Ab (CADM-140)(RDL): 42 U — ABNORMAL HIGH (ref <20)
Anti-Mi-2 Ab (RDL): NEGATIVE
Anti-NXP-2 (P140) Ab (RDL): <20 U (ref <20)
Anti-OJ Ab (RDL): NEGATIVE
Anti-PL-12 Ab (RDL: NEGATIVE
Anti-PL-7 Ab (RDL): NEGATIVE
Anti-PM/Scl-100 Ab (RDL): <20 U (ref <20)
Anti-SAE1 Ab, IgG (RDL): <20 U (ref <20)
Anti-SRP Ab (RDL): NEGATIVE
Anti-SS-A 52kD Ab, IgG (RDL): 23 U — ABNORMAL HIGH (ref <20)
Anti-TIF-1gamma Ab (RDL): <20 U (ref <20)
Anti-U1 RNP Ab (RDL): <20 U (ref <20)
Anti-U2 RNP Ab (RDL): NEGATIVE
Anti-U3 RNP (Fibrillarin)(RDL): NEGATIVE

## 2024-06-14 ENCOUNTER — Telehealth: Payer: Self-pay | Admitting: Pulmonary Disease

## 2024-06-14 DIAGNOSIS — J849 Interstitial pulmonary disease, unspecified: Secondary | ICD-10-CM

## 2024-06-14 MED ORDER — PREDNISONE 10 MG PO TABS: 20.0000 mg | ORAL_TABLET | Freq: Every day | ORAL | 1 refills | Status: DC

## 2024-06-14 MED ORDER — SULFAMETHOXAZOLE-TRIMETHOPRIM 800-160 MG PO TABS: 1.0000 | ORAL_TABLET | ORAL | 1 refills | Status: DC

## 2024-06-14 NOTE — Telephone Encounter (Signed)
 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 I reviewed with her and her husband that MDA-5 can possibly be a rapidly progressing lung disease and we will monitor her closely.  Along with her muscle and joint aches she has been experiencing hair loss/thinning and dry eyes and mouth.   I have recommended we start 20mg  of prednisone  daily and to start taking bactrim  DS 1 tab 3 days per week. I have also recommended we start her on CellCept. Please add her to Devki's schedule to initiate CellCept. We will plan a steroid taper once cellcept is in place.  Lastly, can you please move her PFT tests to an earlier time, hopefully within the next week or two.   Thanks, Thom

## 2024-06-15 ENCOUNTER — Telehealth: Payer: Self-pay | Admitting: Pulmonary Disease

## 2024-06-15 NOTE — Telephone Encounter (Signed)
 See last signed encounter. Pt called back and I was able to make Cellcept appt at [facility] direction. NFN

## 2024-06-15 NOTE — Telephone Encounter (Signed)
 Pt returned call set ppt.  - NFN

## 2024-06-15 NOTE — Telephone Encounter (Signed)
 VM / LM x1

## 2024-06-15 NOTE — Telephone Encounter (Signed)
 Patient is scheduled for 9/2 @ 3pm

## 2024-06-18 NOTE — Progress Notes (Unsigned)
 Pharmacy Note  Subjective: Patient presents today to the [facility] clinic for initial visit with pharmacy team for counseling on mycophenolate  (CellCept ). 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 PMH includes GERD, HTN, asthma, prior history of COVID-19 infection (Dec 2023), and prior tobacco use (quit 2019). Followed by rheumatology, Dr. Jeannetta, ***  Initial OV with Dr. Kara on 03/12/24. Patient was referred to pulmonary clinic for concern of sarcoidosis. She was experiencing cough and SOB, worsened with  physical activity. Symptoms improve with prednisone . She also had diffuse arthralgia/myalgias. Diffuse ground glass attenuation and peribronchovascular nodular findings on HRCT from Jan 2025 and mild adenonopathy. Per documentation, differential included sarcoidosis vs myositis vs other autoimmune condition.  Last OV with Dr. Kara on 05/27/24. S/p  bronchoscopy 05/19/24. Mediastinal biopsies did not show granulomatous inflammation. She noted joint pains in her knees and hands, previously had skin rash along hairline and around nose and cheeks.   Myositis panel with elevated SSA and MDA-5 antibodies on 05/27/24. Dr. 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 See telephone note 06/14/24. She was started on prednisone  20mg  daily, Bactrim  DS three days per week, and scheduled PharmD visit to start CellCept . Plan for steroid taper once CellCept  is in place.   *** Today, patient reports white discharge in the belly button, applied neosporin and today it appears cleared up.   Objective: CBC    Component Value Date/Time   WBC 10.3 05/19/2024 0557   RBC 4.71 05/19/2024 0557   HGB 13.0 05/19/2024 0557   HGB 11.8  09/06/2020 0942   HCT 40.3 05/19/2024 0557   HCT 36.2 09/06/2020 0942   PLT 318 05/19/2024 0557   PLT 274 09/06/2020 0942   MCV 85.6 05/19/2024 0557   MCV 87 09/06/2020 0942   MCH 27.6 05/19/2024 0557   MCHC 32.3 05/19/2024 0557   RDW 15.2 05/19/2024 0557   RDW 12.8 09/06/2020 0942   LYMPHSABS 1.8 03/12/2024 1021   LYMPHSABS 2.3 07/10/2018 1010   MONOABS 0.8 03/12/2024 1021   EOSABS 0.5 03/12/2024 1021   EOSABS 0.1 07/10/2018 1010   BASOSABS 0.1 03/12/2024 1021   BASOSABS 0.0 07/10/2018 1010    CMP     Component Value Date/Time   NA 134 (L) 05/19/2024 0557   NA 137 07/18/2023 1049   K 4.0 05/19/2024 0557   CL 103 05/19/2024 0557   CO2 22 05/19/2024 0557   GLUCOSE 99 05/19/2024 0557   BUN 9 05/19/2024 0557   BUN 9 07/18/2023 1049   CREATININE 0.86 05/19/2024 0557   CALCIUM 9.4 05/19/2024 0557   PROT 9.0 (H) 03/12/2024 1021   PROT 8.0 07/18/2023 1049   ALBUMIN 3.7 03/12/2024 1021   ALBUMIN 4.0 07/18/2023 1049   AST 16 03/12/2024 1021   ALT 11 03/12/2024 1021   ALKPHOS 55 03/12/2024 1021   BILITOT 0.4 03/12/2024 1021   BILITOT 0.3 07/18/2023 1049   GFRNONAA >60 05/19/2024 0557   GFRAA 94 09/06/2020 0942    Baseline Immunosuppressant Therapy Labs TB GOLD   Hepatitis Panel    Latest Ref Rng & Units 09/06/2020    9:42 AM  Hepatitis  Hep B Surface Ag Negative Negative    HIV Lab Results  Component Value Date   HIV Non Reactive 09/06/2020   HIV Non  Reactive 10/16/2018   Immunoglobulins   SPEP    Latest Ref Rng & Units 03/12/2024   10:21 AM  Serum Protein Electrophoresis  Total Protein 6.0 - 8.3 g/dL 9.0    H3EI No results found for: G6PDH   Chest-xray:  *** HRCT Jan 2025  Contraception: *** Nexplanon  in place (3y ago) per documentation   Assessment/Plan:  Patient was counseled on the purpose, proper use, and adverse effects of mycophenolate  including risk of infection, new or reactivation of viral infections, nausea, and headaches.  Discussed  warning of increased risk of development of lymphoma and other malignancies, particularly of the skin.  Discussed risk of neutropenia and discussed importance of regular labs to monitor blood counts, liver function, and kidney function.  Reviewed risk of congenital malformations, and the importance of contraception while on this medication.  Counseled patient on importance of taking PCP prophylaxis while on mycophenolate .  *** She is currently taking Bactrim  DS three times weekly. Counseled patient on purpose, proper use, and adverse effects of sulfamethoxazole /trimethoprim  three times a week. *** OR *** Due to patient's sulfa  allergy, will initiate patient on dapsone 100 mg daily.  Counseled patient on purpose, proper use, and adverse effects of Dapsone.  *** G6PD ordered today.  Provided patient with educational materials and answered all questions.  Patient consented to mycophenolate .    Plan:  - Patient will go to lab today for baseline labs. Pharmacy team will reach out to patient when labs result.  - If safe pending lab results, plan to start CellCept : 500mg  tab BID x 14 days, then repeat labs (CBC/CMP). ***   *** Monitoring: Complete blood count (weekly for first month, twice monthly during months 2 and 3, then monthly thereafter through the first year); renal and liver function; signs and symptoms of bacterial, fungal, protozoal, new or reactivated viral (including reactivation of HBV or HCV), or opportunistic infections; neurological symptoms (eg, hemiparesis, confusion, cognitive deficiencies, ataxia) suggestive of progressive multifocal leukoencephalopathy; monitor for signs/symptoms (eg, fever, arthralgias, arthritis, muscle pain, proinflammatory markers) suggestive of acute inflammatory syndrome; pregnancy test (sensitivity of >=25 milliunits/mL; immediately prior to initiation and 8 to 10 days later in patients who may become pregnant, followed by repeat tests during therapy); monitor skin (for  lesions suspicious of skin cancer); monitor for signs of lymphoma; monitor for signs of pure red cell aplasia or autoimmune hemolytic anemia ***

## 2024-06-20 LAB — FUNGUS CULTURE WITH STAIN

## 2024-06-20 LAB — FUNGUS CULTURE RESULT

## 2024-06-20 LAB — FUNGAL ORGANISM REFLEX

## 2024-06-23 ENCOUNTER — Ambulatory Visit (INDEPENDENT_AMBULATORY_CARE_PROVIDER_SITE_OTHER): Admitting: Pharmacist

## 2024-06-23 ENCOUNTER — Encounter: Payer: Self-pay | Admitting: Pharmacist

## 2024-06-23 DIAGNOSIS — Z1159 Encounter for screening for other viral diseases: Secondary | ICD-10-CM | POA: Diagnosis not present

## 2024-06-23 DIAGNOSIS — Z2989 Encounter for other specified prophylactic measures: Secondary | ICD-10-CM | POA: Diagnosis not present

## 2024-06-23 DIAGNOSIS — J849 Interstitial pulmonary disease, unspecified: Secondary | ICD-10-CM

## 2024-06-23 DIAGNOSIS — Z79899 Other long term (current) drug therapy: Secondary | ICD-10-CM | POA: Diagnosis not present

## 2024-06-23 DIAGNOSIS — Z111 Encounter for screening for respiratory tuberculosis: Secondary | ICD-10-CM | POA: Diagnosis not present

## 2024-06-23 NOTE — Patient Instructions (Addendum)
 It was nice to see you today!    The plan we reviewed today is outlined below:    - You will CONTINUE prednisone  20mg  daily as prescribed by Dr. Kara.  - You will go to the lab today and we will call you with results to ensure it is safe to start CellCept .   - Pending lab results, you will start CellCept  (mycophenolate  mofetil) 500mg  (1 tablet) by mouth twice daily for the next 14 days. See table below for further information.    Week CellCept  dose  Labs due  1-2 500mg  twice daily  CBC/CMP after Week 2  REPEAT LABS  3-4 1,000mg  twice daily CBC/CMP after Week 4  REPEAT LABS  5 and onwards 1,500mg  twice daily CBC/CMP after Week 6 then every 3 months    - You will need repeat labs two weeks after starting therapy. You will hear from the pharmacy team with your follow-up lab results.    - Keep your follow-up appointment with Dr. Kara on 08/20/24   - As always, reach out to the clinic if you have any new concerns that come up.

## 2024-06-26 ENCOUNTER — Other Ambulatory Visit: Payer: Self-pay | Admitting: *Deleted

## 2024-06-26 DIAGNOSIS — J849 Interstitial pulmonary disease, unspecified: Secondary | ICD-10-CM

## 2024-06-26 LAB — QUANTIFERON-TB GOLD PLUS
Mitogen-NIL: 7.07 [IU]/mL
NIL: 0.01 [IU]/mL
QuantiFERON-TB Gold Plus: NEGATIVE
TB1-NIL: 0 [IU]/mL
TB2-NIL: 0 [IU]/mL

## 2024-06-26 MED ORDER — MYCOPHENOLATE MOFETIL 500 MG PO TABS: ORAL_TABLET | ORAL | 0 refills | Status: DC

## 2024-06-27 LAB — GLUCOSE 6 PHOSPHATE DEHYDROGENASE: G-6PDH: 8.2 U/g{Hb} (ref 7.0–20.5)

## 2024-06-27 LAB — HEPATITIS B CORE ANTIBODY, IGM: Hep B C IgM: NONREACTIVE

## 2024-06-30 ENCOUNTER — Ambulatory Visit (INDEPENDENT_AMBULATORY_CARE_PROVIDER_SITE_OTHER): Admitting: Pulmonary Disease

## 2024-06-30 ENCOUNTER — Ambulatory Visit: Payer: Self-pay

## 2024-06-30 DIAGNOSIS — J849 Interstitial pulmonary disease, unspecified: Secondary | ICD-10-CM

## 2024-06-30 LAB — PULMONARY FUNCTION TEST
DL/VA % pred: 61 %
DL/VA: 2.66 ml/min/mmHg/L
DLCO cor % pred: 37 %
DLCO cor: 9.07 ml/min/mmHg
DLCO unc % pred: 36 %
DLCO unc: 8.95 ml/min/mmHg
FEF 25-75 Post: 2.93 L/s
FEF 25-75 Pre: 2.4 L/s
FEF2575-%Change-Post: 21 %
FEF2575-%Pred-Post: 87 %
FEF2575-%Pred-Pre: 71 %
FEV1-%Change-Post: 2 %
FEV1-%Pred-Post: 68 %
FEV1-%Pred-Pre: 67 %
FEV1-Post: 2.29 L
FEV1-Pre: 2.24 L
FEV1FVC-%Change-Post: 1 %
FEV1FVC-%Pred-Pre: 102 %
FEV6-%Change-Post: 0 %
FEV6-%Pred-Post: 66 %
FEV6-%Pred-Pre: 66 %
FEV6-Post: 2.67 L
FEV6-Pre: 2.65 L
FEV6FVC-%Pred-Post: 101 %
FEV6FVC-%Pred-Pre: 101 %
FVC-%Change-Post: 0 %
FVC-%Pred-Post: 65 %
FVC-%Pred-Pre: 64 %
FVC-Post: 2.67 L
FVC-Pre: 2.65 L
Post FEV1/FVC ratio: 86 %
Post FEV6/FVC ratio: 100 %
Pre FEV1/FVC ratio: 84 %
Pre FEV6/FVC Ratio: 100 %
RV % pred: 64 %
RV: 1.1 L
TLC % pred: 69 %
TLC: 3.85 L

## 2024-06-30 NOTE — Progress Notes (Signed)
 Full PFT performed today.

## 2024-06-30 NOTE — Patient Instructions (Signed)
 Full PFT performed today.

## 2024-06-30 NOTE — Progress Notes (Signed)
 Baseline labs wnl. ATC patient to inform of lab results and enter prescription for CellCept . Left HIPAA compliant VM at [phone] with contact number to return call.   Aleck Puls, PharmD, BCPS Clinical Pharmacist  [facility]
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
## 2024-07-01 NOTE — Telephone Encounter (Signed)
 Copied from CRM #[id number]. Topic: Clinical - Medical Advice >> Jul 01, 2024 11:27 AM Lavanda D wrote: Reason for CRM: Patient would like some help regarding the instructions on how to take mycophenolate  (CELLCEPT ) 500 MG tablet.  Called and spoke with the patient. Advised directions per pharmacists in secure chat  It can be taken with or without food. However, some people have stomach-related side effects and sometimes taking with food can help mitigate  But it doesn't need to be taken on an empty stomach or with food, so either way is fine from an efficacy standpoint

## 2024-07-02 NOTE — Addendum Note (Signed)
 Addended by: Arilla Hice L on: 07/02/2024 02:30 PM   Modules accepted: Orders

## 2024-07-02 NOTE — Progress Notes (Signed)
 Spoke with patient. She picked up Rx for CellCept  at [facility]. She was confused by written instructions regarding administration she received at the pharmacy. Clarified that she can take with food. Repeat labs ordered for 2 weeks. Scheduled for lab appt on 9/19.   Aleck Puls, PharmD, BCPS Clinical Pharmacist  [facility]

## 2024-07-03 LAB — ACID FAST CULTURE WITH REFLEXED SENSITIVITIES (MYCOBACTERIA): Acid Fast Culture: NEGATIVE

## 2024-07-13 ENCOUNTER — Encounter: Payer: Self-pay | Admitting: Internal Medicine

## 2024-07-13 ENCOUNTER — Ambulatory Visit: Attending: Internal Medicine | Admitting: Internal Medicine

## 2024-07-13 VITALS — BP 114/65 | HR 88 | Temp 98.0°F | Resp 16 | Ht 66.0 in | Wt 230.8 lb

## 2024-07-13 DIAGNOSIS — M3311 Other dermatopolymyositis with respiratory involvement: Secondary | ICD-10-CM | POA: Diagnosis not present

## 2024-07-13 DIAGNOSIS — J849 Interstitial pulmonary disease, unspecified: Secondary | ICD-10-CM

## 2024-07-13 DIAGNOSIS — E559 Vitamin D deficiency, unspecified: Secondary | ICD-10-CM | POA: Diagnosis not present

## 2024-07-13 DIAGNOSIS — D860 Sarcoidosis of lung: Secondary | ICD-10-CM

## 2024-07-17 ENCOUNTER — Other Ambulatory Visit: Payer: Self-pay

## 2024-07-17 ENCOUNTER — Telehealth: Payer: Self-pay | Admitting: Pulmonary Disease

## 2024-07-17 ENCOUNTER — Other Ambulatory Visit (INDEPENDENT_AMBULATORY_CARE_PROVIDER_SITE_OTHER)

## 2024-07-17 ENCOUNTER — Ambulatory Visit: Payer: Self-pay

## 2024-07-17 DIAGNOSIS — Z79899 Other long term (current) drug therapy: Secondary | ICD-10-CM

## 2024-07-17 LAB — COMPREHENSIVE METABOLIC PANEL WITH GFR
ALT: 14 U/L (ref 0–35)
AST: 13 U/L (ref 0–37)
Albumin: 3.7 g/dL (ref 3.5–5.2)
Alkaline Phosphatase: 44 U/L (ref 39–117)
BUN: 10 mg/dL (ref 6–23)
CO2: 23 meq/L (ref 19–32)
Calcium: 9.2 mg/dL (ref 8.4–10.5)
Chloride: 104 meq/L (ref 96–112)
Creatinine, Ser: 0.81 mg/dL (ref 0.40–1.20)
GFR: 91.08 mL/min (ref >60.00)
Glucose, Bld: 84 mg/dL (ref 70–99)
Potassium: 3.9 meq/L (ref 3.5–5.1)
Sodium: 136 meq/L (ref 135–145)
Total Bilirubin: 0.5 mg/dL (ref 0.2–1.2)
Total Protein: 7.6 g/dL (ref 6.0–8.3)

## 2024-07-17 LAB — CBC WITH DIFFERENTIAL/PLATELET
Basophils Absolute: 0.1 K/uL (ref 0.0–0.1)
Basophils Relative: 0.7 % (ref 0.0–3.0)
Eosinophils Absolute: 0.4 K/uL (ref 0.0–0.7)
Eosinophils Relative: 3.5 % (ref 0.0–5.0)
HCT: 38.7 % (ref 36.0–46.0)
Hemoglobin: 12.8 g/dL (ref 12.0–15.0)
Lymphocytes Relative: 31.6 % (ref 12.0–46.0)
Lymphs Abs: 3.3 K/uL (ref 0.7–4.0)
MCHC: 33.1 g/dL (ref 30.0–36.0)
MCV: 85.7 fl (ref 78.0–100.0)
Monocytes Absolute: 0.9 K/uL (ref 0.1–1.0)
Monocytes Relative: 8.7 % (ref 3.0–12.0)
Neutro Abs: 5.8 K/uL (ref 1.4–7.7)
Neutrophils Relative %: 55.5 % (ref 43.0–77.0)
Platelets: 287 K/uL (ref 150.0–400.0)
RBC: 4.51 Mil/uL (ref 3.87–5.11)
RDW: 15.6 % — ABNORMAL HIGH (ref 11.5–15.5)
WBC: 10.5 K/uL (ref 4.0–10.5)

## 2024-07-17 NOTE — Telephone Encounter (Signed)
 Patient is currently on prednisone  but is not sure for how long she should take it. It is not tapered, only states to take two per day. Please call patient with an update on how long and how often she should be taking medication.

## 2024-07-17 NOTE — Telephone Encounter (Signed)
 Please advise on how to meds

## 2024-07-17 NOTE — Progress Notes (Signed)
 Next lab orders placed for patient to obtain in 2 weeks.

## 2024-07-17 NOTE — Progress Notes (Signed)
 Lab requested changes to lab orders for CBC/CMP. Changes entered.

## 2024-07-17 NOTE — Addendum Note (Signed)
 Addended by: Oval Moralez L on: 07/17/2024 01:08 PM   Modules accepted: Orders

## 2024-07-19 ENCOUNTER — Ambulatory Visit: Payer: Self-pay | Admitting: Pulmonary Disease

## 2024-07-27 ENCOUNTER — Ambulatory Visit
Admission: RE | Admit: 2024-07-27 | Discharge: 2024-07-27 | Disposition: A | Discharge status: Home / Self Care | Source: Ambulatory Visit | Attending: Obstetrics & Gynecology | Admitting: Obstetrics & Gynecology

## 2024-07-27 DIAGNOSIS — Z1231 Encounter for screening mammogram for malignant neoplasm of breast: Secondary | ICD-10-CM | POA: Diagnosis not present

## 2024-07-29 ENCOUNTER — Ambulatory Visit: Payer: Self-pay | Admitting: Obstetrics & Gynecology

## 2024-07-31 NOTE — Telephone Encounter (Signed)
 Please advise on Flu shot recommendations

## 2024-08-03 ENCOUNTER — Telehealth: Payer: Self-pay

## 2024-08-03 NOTE — Telephone Encounter (Signed)
ATC left VM

## 2024-08-03 NOTE — Telephone Encounter (Signed)
 Patient due for CellCept  labs as previously discussed (see results follow up 07/17/24).   Called patient to discuss -   She did not increase dose of CellCept  after last labs. Taking CellCept  500mg  twice daily. Sometimes misses a dose when she is getting ready for work. Denies side effects on regimen. Denies barriers to CellCept  titration aside from remembering to take the dose when she is getting ready for work.   Reviewed plan to increase dose to CellCept  1000mg  BID for 2 weeks then repeat labs. Lab orders are in. She verbalizes intent to increase dose to CellCept  1000mg  BID starting today.   Due for repeat labs on/around 08/17/24. Next OV with Dr. Kara 08/20/24 - okay to schedule lab appointment same day as visit with Dr. Kara. Patient is aware to continue CellCept  1000mg  BID until instructed to increase pending repeat lab results.   Routing to scheduling team to assist with booking lab appointment.   Aleck Puls, PharmD, BCPS, CPP Clinical Pharmacist  [facility]

## 2024-08-04 NOTE — Telephone Encounter (Signed)
 PT has been scheduled.

## 2024-08-06 ENCOUNTER — Other Ambulatory Visit: Payer: Self-pay | Admitting: Pulmonary Disease

## 2024-08-06 DIAGNOSIS — J849 Interstitial pulmonary disease, unspecified: Secondary | ICD-10-CM

## 2024-08-10 ENCOUNTER — Other Ambulatory Visit: Payer: Self-pay | Admitting: Pulmonary Disease

## 2024-08-10 DIAGNOSIS — J849 Interstitial pulmonary disease, unspecified: Secondary | ICD-10-CM

## 2024-08-19 ENCOUNTER — Encounter

## 2024-08-20 ENCOUNTER — Other Ambulatory Visit (INDEPENDENT_AMBULATORY_CARE_PROVIDER_SITE_OTHER)

## 2024-08-20 ENCOUNTER — Encounter: Payer: Self-pay | Admitting: Pulmonary Disease

## 2024-08-20 ENCOUNTER — Ambulatory Visit: Admitting: Pulmonary Disease

## 2024-08-20 DIAGNOSIS — R760 Raised antibody titer: Secondary | ICD-10-CM

## 2024-08-20 DIAGNOSIS — M351 Other overlap syndromes: Secondary | ICD-10-CM

## 2024-08-20 DIAGNOSIS — R590 Localized enlarged lymph nodes: Secondary | ICD-10-CM | POA: Diagnosis not present

## 2024-08-20 DIAGNOSIS — J849 Interstitial pulmonary disease, unspecified: Secondary | ICD-10-CM

## 2024-08-20 DIAGNOSIS — Z79899 Other long term (current) drug therapy: Secondary | ICD-10-CM | POA: Diagnosis not present

## 2024-08-20 LAB — COMPREHENSIVE METABOLIC PANEL WITH GFR
ALT: 11 U/L (ref 0–35)
AST: 18 U/L (ref 0–37)
Albumin: 3.9 g/dL (ref 3.5–5.2)
Alkaline Phosphatase: 41 U/L (ref 39–117)
BUN: 10 mg/dL (ref 6–23)
CO2: 24 meq/L (ref 19–32)
Calcium: 9.2 mg/dL (ref 8.4–10.5)
Chloride: 103 meq/L (ref 96–112)
Creatinine, Ser: 0.85 mg/dL (ref 0.40–1.20)
GFR: 85.91 mL/min (ref >60.00)
Glucose, Bld: 99 mg/dL (ref 70–99)
Potassium: 4.1 meq/L (ref 3.5–5.1)
Sodium: 134 meq/L — ABNORMAL LOW (ref 135–145)
Total Bilirubin: 0.3 mg/dL (ref 0.2–1.2)
Total Protein: 8.6 g/dL — ABNORMAL HIGH (ref 6.0–8.3)

## 2024-08-20 LAB — CBC WITH DIFFERENTIAL/PLATELET
Basophils Absolute: 0.1 K/uL (ref 0.0–0.1)
Basophils Relative: 0.9 % (ref 0.0–3.0)
Eosinophils Absolute: 0.5 K/uL (ref 0.0–0.7)
Eosinophils Relative: 5.6 % — ABNORMAL HIGH (ref 0.0–5.0)
HCT: 40.2 % (ref 36.0–46.0)
Hemoglobin: 12.9 g/dL (ref 12.0–15.0)
Lymphocytes Relative: 28.7 % (ref 12.0–46.0)
Lymphs Abs: 2.4 K/uL (ref 0.7–4.0)
MCHC: 31.9 g/dL (ref 30.0–36.0)
MCV: 87.7 fl (ref 78.0–100.0)
Monocytes Absolute: 1 K/uL (ref 0.1–1.0)
Monocytes Relative: 12 % (ref 3.0–12.0)
Neutro Abs: 4.4 K/uL (ref 1.4–7.7)
Neutrophils Relative %: 52.8 % (ref 43.0–77.0)
Platelets: 324 K/uL (ref 150.0–400.0)
RBC: 4.59 Mil/uL (ref 3.87–5.11)
RDW: 14.7 % (ref 11.5–15.5)
WBC: 8.2 K/uL (ref 4.0–10.5)

## 2024-08-20 MED ORDER — MYCOPHENOLATE MOFETIL 500 MG PO TABS: 1500.0000 mg | ORAL_TABLET | Freq: Two times a day (BID) | ORAL | 3 refills | Status: AC

## 2024-08-20 MED ORDER — SULFAMETHOXAZOLE-TRIMETHOPRIM 800-160 MG PO TABS: 1.0000 | ORAL_TABLET | ORAL | 3 refills | Status: AC

## 2024-08-20 NOTE — Patient Instructions (Addendum)
 We will follow up on labs today  I will send in a prescription for the cellcept  1,500mg  twice daily if labs look good  Schedule lab check in 2 weeks  Continue bactrim  DS 1 tab 3 days per week  Continue spiriva  daily and advair diskus 1 puff twice daily  Follow up in 3 months, call sooner if needed

## 2024-08-20 NOTE — Addendum Note (Signed)
 Addended by: Tedi Hughson on: 08/20/2024 05:16 PM   Modules accepted: Orders

## 2024-08-20 NOTE — Progress Notes (Signed)
 Synopsis: Referred in May 2025 for Sarcoidosis  Subjective:   PATIENT ID: Brianna Jordan GENDER: female DOB: 10/11/1986, MRN: [id number]  HPI  Chief Complaint  Patient presents with   Medical Management of Chronic Issues   Brianna Jordan is a 38 year old female, former smoker with history of GERD, hypertension and asthma who retuns to pulmonary clinic for concern of sarcoidosis.  OV 02/2024 She was seen by Dr. Jeannetta of rheumatology on 12/31/23, note reviewed.  She experiences persistent coughing and shortness of breath, worsened by physical activity and alleviated when seated. She experiences choking during sleep and possible snoring. Daytime sleepiness and frequent coughing fits with mucus production are present. Cold weather exacerbates her symptoms. She denies post-nasal drainage. She reports improvement in her prednisone  and spiriva  with improvement in her symptoms while on prednisone . She has diffuse muscle and joint aches.  She had covid 19 infection December 2023. 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 She has a history of smoking, sharing a pack every other day from age 64 until quitting in 2019.   HRCT Chest 10/2023 showed peribronchovascular nodularity and ground glass infiltrates with craniocaudal gradient. Subpleural emphysema vs cyst formation in areas of ground glass attenuation. Mediastinal adenopathy present at station 7.  FH: Cousin on mother's side, has MS  OV 05/27/24 Patient underwent bronchoscopy 05/19/24 with with biopsies of 4L, 7 and 10R with cytology showing normal lymphoid tissue. Transbronchial biopsies showed bronchial epithelium and cartilage and benign bronchial cells.   Reviewed cytology/pathology results with patient.   She is having joint pains in her knees and hands. She did have skin rash along her hairline and around her nose and cheeks previously.  OV 08/20/24 She experiences significant night sweats almost every night,  which have intensified since increasing the CellCept  dose. She feels like she is running very hot most nights. Otherwise no issues during the day. There is no fever or other signs of infection. She denies cough or mucous production. She is currently taking BactrimDS 3 days per week, Wixela, Spiriva , and CellCept . She ran out of CellCept  yesterday and did not have sweats last night. She has not completed a sleep study due to cost concerns but no longer wakes up coughing as she used to. Her family history includes sarcoidosis and sickle cell disease after recently discussing with her cousins.  She increased her cellcept  dose to 2 tabs twice daily at the beginning of the month and had labs drawn prior to our visit.  Past Medical History:  Diagnosis Date   Allergy    When its pollen season   Anemia    as a child, no problems as an adult   Anxiety    reports dx from previous MD   Arthritis    Asthma    After i caught covid-19 in in 2023   Bipolar disorder (HCC)    no meds per patient   Dyspnea    with exertion   GERD (gastroesophageal reflux disease)    Hypertension    no meds   Osteoarthritis    Other emphysema (HCC) 12/02/2023   Sjogren syndrome 05/2024     Family History  Problem Relation Age of Onset   Anxiety disorder Mother    Diabetes Mother    Obesity Mother    Stroke Mother    ADD / ADHD Daughter    Cancer Maternal Aunt    Diabetes Maternal Grandfather      Social History   Socioeconomic  History   Marital status: Married    Spouse name: Not on file   Number of children: 2   Years of education: Not on file   Highest education level: 12th grade  Occupational History   Not on file  Tobacco Use   Smoking status: Former    Current packs/day: 0.50    Types: Cigarettes    Passive exposure: Past   Smokeless tobacco: Never   Tobacco comments:    Smoke 1/2 ppd, Patient quit smoking in 2021.  Vaping Use   Vaping status: Never Used  Substance and Sexual Activity    Alcohol use: Yes    Comment: occasional wine   Drug use: No   Sexual activity: Yes    Birth control/protection: Implant    Comment: Nexplanon   Other Topics Concern   Not on file  Social History Narrative   Not on file   Social Drivers of Health   Financial Resource Strain: Medium Risk (07/17/2023)   Overall Financial Resource Strain (CARDIA)    Difficulty of Paying Living Expenses: Somewhat hard  Food Insecurity: Food Insecurity Present (07/17/2023)   Hunger Vital Sign    Worried About Running Out of Food in the Last Year: Sometimes true    Ran Out of Food in the Last Year: Sometimes true  Transportation Needs: No Transportation Needs (07/17/2023)   PRAPARE - Administrator, Civil Service (Medical): No    Lack of Transportation (Non-Medical): No  Physical Activity: Insufficiently Active (07/17/2023)   Exercise Vital Sign    Days of Exercise per Week: 6 days    Minutes of Exercise per Session: 20 min  Stress: No Stress Concern Present (07/17/2023)   [employer] of Occupational Health - Occupational Stress Questionnaire    Feeling of Stress : Only a little  Social Connections: Moderately Isolated (07/17/2023)   Social Connection and Isolation Panel    Frequency of Communication with Friends and Family: Three times a week    Frequency of Social Gatherings with Friends and Family: Once a week    Attends Religious Services: Never    Database administrator or Organizations: No    Attends Engineer, structural: Not on file    Marital Status: Married  Catering manager Violence: Not on file     Allergies  Allergen Reactions   Bee Pollen    Dust Mite Extract    Shrimp (Diagnostic)     Swelling in mouth, nausea, vomiting     Outpatient Medications Prior to Visit  Medication Sig Dispense Refill   acetaminophen  (TYLENOL ) 325 MG tablet Take 650 mg by mouth every 6 (six) hours as needed.     albuterol  (VENTOLIN  HFA) 108 (90 Base) MCG/ACT inhaler Inhale 2 puffs  into the lungs every 6 (six) hours as needed for wheezing or shortness of breath. 8 g 2   methocarbamol  (ROBAXIN ) 500 MG tablet Take 1 tablet (500 mg total) by mouth 2 (two) times daily. 20 tablet 0   Multiple Vitamin (MULTIVITAMIN PO) Take by mouth.     mycophenolate  (CELLCEPT ) 500 MG tablet Take 1 tablet (500mg ) by mouth twice daily for 14 days. REPEAT LABS. If stable, increase to 2 tablets (1,000mg ) twice daily for 14 days. 84 tablet 0   sulfamethoxazole -trimethoprim  (BACTRIM  DS) 800-160 MG tablet TAKE 1 TABLET BY MOUTH THREE TIMES A WEEK 12 tablet 1   Tiotropium Bromide  Monohydrate (SPIRIVA  RESPIMAT) 2.5 MCG/ACT AERS Inhale 2 puffs into the lungs daily. 4 g 5  WIXELA INHUB 250-50 MCG/ACT AEPB INHALE 1 PUFF INTO THE LUNGS IN THE MORNING AND 1 PUFF AT BEDTIME 180 each 2   predniSONE  (DELTASONE ) 10 MG tablet Take 2 tablets (20 mg total) by mouth daily with breakfast. 60 tablet 1   No facility-administered medications prior to visit.   Review of Systems  Constitutional:  Positive for diaphoresis. Negative for chills, fever, malaise/fatigue and weight loss.  HENT:  Negative for congestion, sinus pain and sore throat.   Eyes: Negative.   Respiratory:  Negative for cough, hemoptysis, sputum production, shortness of breath and wheezing.   Cardiovascular:  Negative for chest pain, palpitations, orthopnea, claudication and leg swelling.  Gastrointestinal:  Negative for abdominal pain, heartburn, nausea and vomiting.  Genitourinary: Negative.   Musculoskeletal:  Positive for joint pain. Negative for back pain and myalgias.  Skin:  Negative for rash.  Neurological:  Negative for weakness.  Endo/Heme/Allergies: Negative.   Psychiatric/Behavioral: Negative.     Objective:   Vitals:   08/20/24 0833  BP: 104/75  Pulse: 79  SpO2: 94%  Weight: 230 lb (104.3 kg)  Height: 5' 6 (1.676 m)   Physical Exam Constitutional:      General: She is not in acute distress.    Appearance: Normal  appearance.  Eyes:     General: No scleral icterus.    Conjunctiva/sclera: Conjunctivae normal.  Cardiovascular:     Rate and Rhythm: Normal rate and regular rhythm.  Pulmonary:     Breath sounds: No wheezing, rhonchi or rales.  Musculoskeletal:     Right lower leg: No edema.     Left lower leg: No edema.  Skin:    General: Skin is warm and dry.  Neurological:     General: No focal deficit present.    CBC    Component Value Date/Time   WBC 10.5 07/17/2024 0946   RBC 4.51 07/17/2024 0946   HGB 12.8 07/17/2024 0946   HGB 11.8 09/06/2020 0942   HCT 38.7 07/17/2024 0946   HCT 36.2 09/06/2020 0942   PLT 287.0 07/17/2024 0946   PLT 274 09/06/2020 0942   MCV 85.7 07/17/2024 0946   MCV 87 09/06/2020 0942   MCH 27.6 05/19/2024 0557   MCHC 33.1 07/17/2024 0946   RDW 15.6 (H) 07/17/2024 0946   RDW 12.8 09/06/2020 0942   LYMPHSABS 3.3 07/17/2024 0946   LYMPHSABS 2.3 07/10/2018 1010   MONOABS 0.9 07/17/2024 0946   EOSABS 0.4 07/17/2024 0946   EOSABS 0.1 07/10/2018 1010   BASOSABS 0.1 07/17/2024 0946   BASOSABS 0.0 07/10/2018 1010      Latest Ref Rng & Units 07/17/2024    9:46 AM 05/19/2024    5:57 AM 03/12/2024   10:21 AM  BMP  Glucose 70 - 99 mg/dL 84  99  893   BUN 6 - 23 mg/dL 10  9  5    Creatinine 0.40 - 1.20 mg/dL 9.18  9.13  9.30   Sodium 135 - 145 mEq/L 136  134  136   Potassium 3.5 - 5.1 mEq/L 3.9  4.0  3.5   Chloride 96 - 112 mEq/L 104  103  106   CO2 19 - 32 mEq/L 23  22  22    Calcium 8.4 - 10.5 mg/dL 9.2  9.4  9.3    Chest imaging: HRCT Chest 03/20/24 1.  Finding is suggestive of fibrotic interstitial lung disease. 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  Small pericardial effusion.  3.  Multistation mediastinal lymphadenopathy.  4.  Enlargement of the pulmonary trunk compatible with pulmonary hypertension.   HRCT Chest 11/19/23 Mediastinum/Nodes: Small mediastinal lymph  nodes. Subcarinal lymph node measures approximately 2.0 cm. Hilar regions are difficult to definitively evaluate without IV contrast. No axillary adenopathy. Hilar regions are difficult to definitively evaluate without IV contrast. Esophagus is grossly unremarkable.   Lungs/Pleura: Peribronchovascular ill-defined nodularity and ground-glass in a craniocaudal gradient. Mild associated pulmonary parenchymal coarsening. Nodularity along the fissures. Mild centrilobular and paraseptal emphysema. No pleural fluid. Airway is unremarkable. No air trapping.  PFT:    Latest Ref Rng & Units 06/30/2024    2:33 PM  PFT Results  FVC-Pre L 2.65   FVC-Predicted Pre % 64   FVC-Post L 2.67   FVC-Predicted Post % 65   Pre FEV1/FVC % % 84   Post FEV1/FCV % % 86   FEV1-Pre L 2.24   FEV1-Predicted Pre % 67   FEV1-Post L 2.29   DLCO uncorrected ml/min/mmHg 8.95   DLCO UNC% % 36   DLCO corrected ml/min/mmHg 9.07   DLCO COR %Predicted % 37   DLVA Predicted % 61   TLC L 3.85   TLC % Predicted % 69   RV % Predicted % 64     Labs: ANA 1:80 speckled RNP + ESR 56 ACE negative SSA/SSB negative C3/C4 negative  05/27/24 myositis panel postive for Anti MDA5 at 42 and positive SSA ab at 23 Path:  Echo:  Heart Catheterization:    Assessment & Plan:   ILD (interstitial lung disease) (HCC)  Discussion: Brianna Jordan is a 38 year old female, former smoker with history of GERD, hypertension and asthma who 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  Mixed Connective Tissue Disease Inflammatory Lung Disease Mediastinal Adenopathy MDA5 and SSA positive antibodies on myositis panel - will follow up labs today and increase cellcept  to 1500mg  twice daily - continue bactrim  DS 1 tab 3 days per week - continue follow up with rheumatology, may consider additional therapy for joint pains - repeat PFTs and HRCT Chest in March 2026, will order at follow up visit in January -  continue spiriva  and advair inahlers  Follow up in 3 month  Dorn Chill, MD [facility] Office: [phone]    Current Outpatient Medications:    acetaminophen  (TYLENOL ) 325 MG tablet, Take 650 mg by mouth every 6 (six) hours as needed., Disp: , Rfl:    albuterol  (VENTOLIN  HFA) 108 (90 Base) MCG/ACT inhaler, Inhale 2 puffs into the lungs every 6 (six) hours as needed for wheezing or shortness of breath., Disp: 8 g, Rfl: 2   methocarbamol  (ROBAXIN ) 500 MG tablet, Take 1 tablet (500 mg total) by mouth 2 (two) times daily., Disp: 20 tablet, Rfl: 0   Multiple Vitamin (MULTIVITAMIN PO), Take by mouth., Disp: , Rfl:    mycophenolate  (CELLCEPT ) 500 MG tablet, Take 1 tablet (500mg ) by mouth twice daily for 14 days. REPEAT LABS. If stable, increase to 2 tablets (1,000mg ) twice daily for 14 days., Disp: 84 tablet, Rfl: 0   sulfamethoxazole -trimethoprim  (BACTRIM  DS) 800-160 MG tablet, TAKE 1 TABLET BY MOUTH THREE TIMES A WEEK, Disp: 12 tablet, Rfl: 1   Tiotropium Bromide  Monohydrate (SPIRIVA  RESPIMAT) 2.5 MCG/ACT AERS, Inhale 2 puffs into the lungs daily., Disp: 4 g, Rfl: 5   WIXELA INHUB 250-50 MCG/ACT AEPB, INHALE 1 PUFF INTO THE LUNGS IN THE MORNING AND 1 PUFF AT BEDTIME, Disp:  180 each, Rfl: 2

## 2024-09-07 ENCOUNTER — Other Ambulatory Visit: Payer: Self-pay

## 2024-09-07 DIAGNOSIS — J849 Interstitial pulmonary disease, unspecified: Secondary | ICD-10-CM
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
## 2024-09-08 ENCOUNTER — Other Ambulatory Visit

## 2024-09-14 ENCOUNTER — Other Ambulatory Visit (INDEPENDENT_AMBULATORY_CARE_PROVIDER_SITE_OTHER)

## 2024-09-14 DIAGNOSIS — J849 Interstitial pulmonary disease, unspecified: Secondary | ICD-10-CM

## 2024-09-14 LAB — CBC WITH DIFFERENTIAL/PLATELET
Basophils Absolute: 0 K/uL (ref 0.0–0.1)
Basophils Relative: 0.6 % (ref 0.0–3.0)
Eosinophils Absolute: 0.4 K/uL (ref 0.0–0.7)
Eosinophils Relative: 6.6 % — ABNORMAL HIGH (ref 0.0–5.0)
HCT: 41.9 % (ref 36.0–46.0)
Hemoglobin: 13.6 g/dL (ref 12.0–15.0)
Lymphocytes Relative: 35.5 % (ref 12.0–46.0)
Lymphs Abs: 2.3 K/uL (ref 0.7–4.0)
MCHC: 32.5 g/dL (ref 30.0–36.0)
MCV: 87.6 fl (ref 78.0–100.0)
Monocytes Absolute: 0.6 K/uL (ref 0.1–1.0)
Monocytes Relative: 9.3 % (ref 3.0–12.0)
Neutro Abs: 3.1 K/uL (ref 1.4–7.7)
Neutrophils Relative %: 48 % (ref 43.0–77.0)
Platelets: 296 K/uL (ref 150.0–400.0)
RBC: 4.78 Mil/uL (ref 3.87–5.11)
RDW: 13.8 % (ref 11.5–15.5)
WBC: 6.5 K/uL (ref 4.0–10.5)

## 2024-09-14 LAB — COMPREHENSIVE METABOLIC PANEL WITH GFR
ALT: 11 U/L (ref 0–35)
AST: 19 U/L (ref 0–37)
Albumin: 4 g/dL (ref 3.5–5.2)
Alkaline Phosphatase: 43 U/L (ref 39–117)
BUN: 9 mg/dL (ref 6–23)
CO2: 23 meq/L (ref 19–32)
Calcium: 9.3 mg/dL (ref 8.4–10.5)
Chloride: 104 meq/L (ref 96–112)
Creatinine, Ser: 0.8 mg/dL (ref 0.40–1.20)
GFR: 92.35 mL/min (ref >60.00)
Glucose, Bld: 90 mg/dL (ref 70–99)
Potassium: 3.8 meq/L (ref 3.5–5.1)
Sodium: 135 meq/L (ref 135–145)
Total Bilirubin: 0.4 mg/dL (ref 0.2–1.2)
Total Protein: 8.6 g/dL — ABNORMAL HIGH (ref 6.0–8.3)

## 2024-09-18 ENCOUNTER — Ambulatory Visit: Attending: Internal Medicine | Admitting: Internal Medicine

## 2024-09-18 ENCOUNTER — Encounter: Payer: Self-pay | Admitting: Internal Medicine

## 2024-09-18 VITALS — BP 111/72 | HR 78 | Temp 97.7°F | Resp 17 | Ht 66.0 in | Wt 229.6 lb

## 2024-09-18 DIAGNOSIS — M3311 Other dermatopolymyositis with respiratory involvement: Secondary | ICD-10-CM | POA: Diagnosis not present

## 2024-09-18 DIAGNOSIS — M25561 Pain in right knee: Secondary | ICD-10-CM | POA: Insufficient documentation

## 2024-09-18 DIAGNOSIS — G8929 Other chronic pain: Secondary | ICD-10-CM | POA: Diagnosis not present

## 2024-09-18 DIAGNOSIS — M25562 Pain in left knee: Secondary | ICD-10-CM

## 2024-09-18 DIAGNOSIS — R7689 Other specified abnormal immunological findings in serum: Secondary | ICD-10-CM

## 2024-09-18 DIAGNOSIS — J849 Interstitial pulmonary disease, unspecified: Secondary | ICD-10-CM

## 2024-09-19 LAB — CK: Total CK: 96 U/L (ref 20–239)

## 2024-09-19 LAB — SEDIMENTATION RATE: Sed Rate: 34 mm/h — ABNORMAL HIGH (ref 0–20)

## 2024-09-28 ENCOUNTER — Other Ambulatory Visit

## 2024-10-20 ENCOUNTER — Telehealth: Payer: Self-pay

## 2024-10-20 NOTE — Telephone Encounter (Signed)
 LMOM for patient to call and schedule follow-up appointment with Dr. Luba.

## 2024-10-20 NOTE — Telephone Encounter (Signed)
-----   Message from Reena Stark sent at 10/20/2024 10:11 AM EST ----- Regarding: FW: Transfer of Care Please call patient and schedule appt with Dr. Luba. Thank you. ----- Message ----- From: Luba Stabs, DO Sent: 10/20/2024  10:06 AM EST To: Reena VEAR Stark, RT Subject: FW: Transfer of Care                            ----- Message ----- From: Kara Dorn NOVAK, MD Sent: 10/16/2024  12:02 PM EST To: Stabs Luba, DO Subject: Transfer of Care                               Hi Stabs,  Would it be possible for patient to follow up with you in the future? She reports wanting to switch providers after her last interaction with Dr. Jeannetta.   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 She continues to have on going joint pain and question would be if she would need a second agent for her joints.  Thanks, JD

## 2024-10-26 ENCOUNTER — Encounter: Payer: Self-pay | Admitting: Pulmonary Disease

## 2024-10-26 NOTE — Telephone Encounter (Signed)
 Copied from CRM #[id number]. Topic: Clinical - Prescription Issue >> Oct 23, 2024 11:21 AM Joesph PARAS wrote: Reason for CRM: Patient states that she was denied her mycophenolate  (CELLCEPT ) 500 MG tablet by her pharmacy, who states she cannot get it until 11/16/2024. Patient was not provided a reason, thinks she missed bloodwork. Patient requesting we send in rx and tell pharmacy to provide her her medication.  NFN

## 2024-11-23 ENCOUNTER — Ambulatory Visit: Admitting: Pulmonary Disease

## 2024-12-22 ENCOUNTER — Ambulatory Visit: Admitting: Internal Medicine

## 2024-12-22 ENCOUNTER — Ambulatory Visit: Payer: Self-pay

## 2025-01-26 ENCOUNTER — Ambulatory Visit: Admitting: Pulmonary Disease
